# Patient Record
Sex: Female | Born: 1955
Health system: Southern US, Community
[De-identification: ages and names within clinical notes are randomized; demographics above are authoritative.]

## PROBLEM LIST (undated history)

## (undated) DIAGNOSIS — K5900 Constipation, unspecified: Secondary | ICD-10-CM

## (undated) DIAGNOSIS — I10 Essential (primary) hypertension: Secondary | ICD-10-CM

## (undated) DIAGNOSIS — E119 Type 2 diabetes mellitus without complications: Secondary | ICD-10-CM

## (undated) DIAGNOSIS — M8580 Other specified disorders of bone density and structure, unspecified site: Secondary | ICD-10-CM

## (undated) DIAGNOSIS — E785 Hyperlipidemia, unspecified: Secondary | ICD-10-CM

## (undated) DIAGNOSIS — Z5189 Encounter for other specified aftercare: Secondary | ICD-10-CM

## (undated) DIAGNOSIS — I209 Angina pectoris, unspecified: Secondary | ICD-10-CM

## (undated) DIAGNOSIS — D649 Anemia, unspecified: Secondary | ICD-10-CM

## (undated) DIAGNOSIS — N189 Chronic kidney disease, unspecified: Secondary | ICD-10-CM

## (undated) DIAGNOSIS — M1990 Unspecified osteoarthritis, unspecified site: Secondary | ICD-10-CM

## (undated) DIAGNOSIS — R011 Cardiac murmur, unspecified: Secondary | ICD-10-CM

## (undated) DIAGNOSIS — T7840XA Allergy, unspecified, initial encounter: Secondary | ICD-10-CM

## (undated) DIAGNOSIS — K219 Gastro-esophageal reflux disease without esophagitis: Secondary | ICD-10-CM

## (undated) DIAGNOSIS — C801 Malignant (primary) neoplasm, unspecified: Secondary | ICD-10-CM

## (undated) DIAGNOSIS — E7800 Pure hypercholesterolemia, unspecified: Secondary | ICD-10-CM

## (undated) HISTORY — PX: OTHER SURGICAL HISTORY: SHX169

## (undated) HISTORY — DX: Malignant (primary) neoplasm, unspecified: C80.1

## (undated) HISTORY — DX: Allergy, unspecified, initial encounter: T78.40XA

## (undated) HISTORY — PX: PELVIC FLOOR REPAIR: SHX2192

## (undated) HISTORY — PX: APPENDECTOMY: SHX54

## (undated) HISTORY — PX: COLONOSCOPY: SHX174

## (undated) HISTORY — DX: Constipation, unspecified: K59.00

## (undated) HISTORY — DX: Other specified disorders of bone density and structure, unspecified site: M85.80

## (undated) HISTORY — PX: POLYPECTOMY: SHX149

## (undated) HISTORY — PX: ABDOMINAL HYSTERECTOMY: SHX81

## (undated) HISTORY — DX: Encounter for other specified aftercare: Z51.89

---
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
## 2016-12-01 ENCOUNTER — Other Ambulatory Visit: Payer: Self-pay | Admitting: Family Medicine

## 2016-12-01 DIAGNOSIS — R0789 Other chest pain: Secondary | ICD-10-CM

## 2016-12-03 ENCOUNTER — Ambulatory Visit
Admission: RE | Admit: 2016-12-03 | Discharge: 2016-12-03 | Disposition: A | Discharge status: Home / Self Care | Source: Ambulatory Visit | Attending: Family Medicine | Admitting: Family Medicine

## 2016-12-03 ENCOUNTER — Other Ambulatory Visit: Payer: Self-pay | Admitting: Family Medicine

## 2016-12-03 DIAGNOSIS — M25551 Pain in right hip: Secondary | ICD-10-CM

## 2016-12-14 ENCOUNTER — Ambulatory Visit (INDEPENDENT_AMBULATORY_CARE_PROVIDER_SITE_OTHER)

## 2016-12-14 DIAGNOSIS — R0789 Other chest pain: Secondary | ICD-10-CM | POA: Diagnosis not present

## 2016-12-14 LAB — EXERCISE TOLERANCE TEST
CHL CUP MPHR: 158 {beats}/min
CHL CUP RESTING HR STRESS: 92 {beats}/min
CSEPHR: 94 %
Estimated workload: 5.8 METS
Exercise duration (min): 4 min
Exercise duration (sec): 0 s
Peak HR: 150 {beats}/min
RPE: 17

## 2017-12-09 ENCOUNTER — Other Ambulatory Visit: Payer: Self-pay | Admitting: Internal Medicine

## 2017-12-09 ENCOUNTER — Ambulatory Visit
Admission: RE | Admit: 2017-12-09 | Discharge: 2017-12-09 | Disposition: A | Discharge status: Home / Self Care | Source: Ambulatory Visit | Attending: Internal Medicine | Admitting: Internal Medicine

## 2017-12-09 DIAGNOSIS — R809 Proteinuria, unspecified: Secondary | ICD-10-CM

## 2017-12-30 ENCOUNTER — Other Ambulatory Visit (HOSPITAL_COMMUNITY): Payer: Self-pay | Admitting: Nephrology

## 2017-12-30 DIAGNOSIS — N049 Nephrotic syndrome with unspecified morphologic changes: Secondary | ICD-10-CM

## 2018-01-03 ENCOUNTER — Other Ambulatory Visit: Payer: Self-pay | Admitting: Radiology

## 2018-01-04 ENCOUNTER — Encounter (HOSPITAL_COMMUNITY): Payer: Self-pay

## 2018-01-04 ENCOUNTER — Ambulatory Visit (HOSPITAL_COMMUNITY)
Admission: RE | Admit: 2018-01-04 | Discharge: 2018-01-04 | Disposition: A | Discharge status: Home / Self Care | Source: Ambulatory Visit | Attending: Nephrology | Admitting: Nephrology

## 2018-01-04 DIAGNOSIS — N189 Chronic kidney disease, unspecified: Secondary | ICD-10-CM | POA: Insufficient documentation

## 2018-01-04 DIAGNOSIS — K219 Gastro-esophageal reflux disease without esophagitis: Secondary | ICD-10-CM | POA: Diagnosis not present

## 2018-01-04 DIAGNOSIS — E1122 Type 2 diabetes mellitus with diabetic chronic kidney disease: Secondary | ICD-10-CM | POA: Diagnosis not present

## 2018-01-04 DIAGNOSIS — N049 Nephrotic syndrome with unspecified morphologic changes: Secondary | ICD-10-CM | POA: Diagnosis present

## 2018-01-04 DIAGNOSIS — I129 Hypertensive chronic kidney disease with stage 1 through stage 4 chronic kidney disease, or unspecified chronic kidney disease: Secondary | ICD-10-CM | POA: Diagnosis not present

## 2018-01-04 DIAGNOSIS — Z79899 Other long term (current) drug therapy: Secondary | ICD-10-CM | POA: Diagnosis not present

## 2018-01-04 DIAGNOSIS — Z87891 Personal history of nicotine dependence: Secondary | ICD-10-CM | POA: Insufficient documentation

## 2018-01-04 HISTORY — DX: Chronic kidney disease, unspecified: N18.9

## 2018-01-04 HISTORY — DX: Type 2 diabetes mellitus without complications: E11.9

## 2018-01-04 HISTORY — DX: Gastro-esophageal reflux disease without esophagitis: K21.9

## 2018-01-04 HISTORY — DX: Cardiac murmur, unspecified: R01.1

## 2018-01-04 HISTORY — DX: Essential (primary) hypertension: I10

## 2018-01-04 LAB — CBC
HCT: 42.6 % (ref 36.0–46.0)
Hemoglobin: 13.6 g/dL (ref 12.0–15.0)
MCH: 24.5 pg — AB (ref 26.0–34.0)
MCHC: 31.9 g/dL (ref 30.0–36.0)
MCV: 76.9 fL — ABNORMAL LOW (ref 78.0–100.0)
PLATELETS: 253 10*3/uL (ref 150–400)
RBC: 5.54 MIL/uL — ABNORMAL HIGH (ref 3.87–5.11)
RDW: 19 % — ABNORMAL HIGH (ref 11.5–15.5)
WBC: 6.3 10*3/uL (ref 4.0–10.5)

## 2018-01-04 LAB — PROTIME-INR
INR: 1
PROTHROMBIN TIME: 13.1 s (ref 11.4–15.2)

## 2018-01-04 MED ORDER — MIDAZOLAM HCL 2 MG/2ML IJ SOLN
INTRAMUSCULAR | Status: AC
  Filled 2018-01-04: qty 4

## 2018-01-04 MED ORDER — SODIUM CHLORIDE 0.9 % IV SOLN
INTRAVENOUS | Status: AC | PRN
  Administered 2018-01-04: 10 mL/h via INTRAVENOUS

## 2018-01-04 MED ORDER — LIDOCAINE HCL (PF) 1 % IJ SOLN
INTRAMUSCULAR | Status: AC
  Filled 2018-01-04: qty 30

## 2018-01-04 MED ORDER — FENTANYL CITRATE (PF) 100 MCG/2ML IJ SOLN
INTRAMUSCULAR | Status: AC
  Filled 2018-01-04: qty 4

## 2018-01-04 MED ORDER — FENTANYL CITRATE (PF) 100 MCG/2ML IJ SOLN
INTRAMUSCULAR | Status: AC | PRN
  Administered 2018-01-04: 25 ug via INTRAVENOUS
  Administered 2018-01-04: 50 ug via INTRAVENOUS

## 2018-01-04 MED ORDER — MIDAZOLAM HCL 2 MG/2ML IJ SOLN
INTRAMUSCULAR | Status: AC | PRN
  Administered 2018-01-04: 1 mg via INTRAVENOUS
  Administered 2018-01-04 (×2): 0.5 mg via INTRAVENOUS

## 2018-01-04 MED ORDER — SODIUM CHLORIDE 0.9 % IV SOLN: INTRAVENOUS | Status: DC

## 2018-01-04 NOTE — Procedures (Signed)
Random renal biopsy 16 g core times two EBL 0 Comp 0

## 2018-01-04 NOTE — H&P (Signed)
Chief Complaint: Patient was seen in consultation today for concern for nephrotic syndrome  Referring Physician(s): Upton,Elizabeth  Supervising Physician: Marybelle Killings  Patient Status: [facility] - Out-pt  History of Present Illness: Mallory Lewis is a 62 y.o. female with past medical history of HTN, GERD, and DM who has recently developed symptoms of acute onset nephrotic syndrome marked by HTN, anasarca, hypercholesterolemia, and proteinuria.  IR consulted for random renal biopsy at the request of Dr. Hollie Salk.  Patient presents for procedure today in her usual state of health.  She has been NPO.  She does not take blood thinners.   Past Medical History:  Diagnosis Date  . Chronic kidney disease   . Diabetes mellitus without complication ([place])   . GERD (gastroesophageal reflux disease)   . Heart murmur   . Hypertension     Past Surgical History:  Procedure Laterality Date  . ABDOMINAL HYSTERECTOMY    . APPENDECTOMY    . mohs procedure    . PELVIC FLOOR REPAIR      Allergies: Ivp dye [iodinated diagnostic agents]  Medications: Prior to Admission medications   Medication Sig Start Date End Date Taking? Authorizing Provider  acetaminophen (TYLENOL) 650 MG CR tablet Take 1,300 mg by mouth every 8 (eight) hours as needed for pain.   Yes [provider]  Cholecalciferol (VITAMIN D-3) 5000 units TABS Take 1 tablet by mouth at bedtime.   Yes [provider]  cyclobenzaprine (FLEXERIL) 10 MG tablet Take 10 mg by mouth 3 (three) times daily as needed for muscle spasms.   Yes [provider]  ferrous sulfate (SLOW RELEASE IRON) 160 (50 Fe) MG TBCR SR tablet Take 1 tablet by mouth 2 (two) times daily.   Yes [provider]  furosemide (LASIX) 40 MG tablet Take 20-40 mg by mouth every other day.   Yes [provider]  imiquimod (ALDARA) 5 % cream Apply 1 application topically daily. Apply to chest for 6 weeks only Mon - Fri   Yes  [provider]  losartan (COZAAR) 25 MG tablet Take 25 mg by mouth every evening.   Yes [provider]  senna (SENOKOT) 8.6 MG tablet Take 3 tablets by mouth at bedtime.   Yes [provider]     Family History  Problem Relation Age of Onset  . Hypothyroidism Mother   . Atrial fibrillation Mother   . Diabetes Mother   . Dementia Father   . CAD Father   . Diabetes Father     Social History   Socioeconomic History  . Marital status: Married    Spouse name: Not on file  . Number of children: Not on file  . Years of education: Not on file  . Highest education level: Not on file  Occupational History  . Not on file  Social Needs  . Financial resource strain: Not on file  . Food insecurity:    Worry: Not on file    Inability: Not on file  . Transportation needs:    Medical: Not on file    Non-medical: Not on file  Tobacco Use  . Smoking status: Former Smoker    Packs/day: 0.25    Types: Cigarettes    Last attempt to quit: 01/04/2014    Years since quitting: 4.0  . Smokeless tobacco: Never Used  Substance and Sexual Activity  . Alcohol use: Never    Frequency: Never  . Drug use: Never  . Sexual activity: Not on file  Lifestyle  . Physical activity:    Days per week: Not on file    Minutes per session: Not on file  . Stress: Not on file  Relationships  . Social connections:    Talks on phone: Not on file    Gets together: Not on file    Attends religious service: Not on file    Active member of club or organization: Not on file    Attends meetings of clubs or organizations: Not on file    Relationship status: Not on file  Other Topics Concern  . Not on file  Social History Narrative  . Not on file     Review of Systems: A 12 point ROS discussed and pertinent positives are indicated in the HPI above.  All other systems are negative.  Review of Systems  Constitutional: Negative for fatigue and fever.  Respiratory: Negative for  cough and shortness of breath.   Cardiovascular: Negative for chest pain.  Gastrointestinal: Negative for abdominal pain.  Musculoskeletal: Positive for back pain (right flank, occasional, none today).  Psychiatric/Behavioral: Negative for behavioral problems and confusion.    Vital Signs: BP (!) 150/96   Pulse 92   Temp 98.2 F (36.8 C) (Oral)   Resp 16   Ht 5\' 5"  (1.651 m)   Wt 194 lb (88 kg)   SpO2 93%   BMI 32.28 kg/m   Physical Exam  Constitutional: She is oriented to person, place, and time. She appears well-developed.  Cardiovascular: Normal rate, regular rhythm and normal heart sounds.  Pulmonary/Chest: Effort normal and breath sounds normal. No respiratory distress.  Abdominal: Soft.  Neurological: She is alert and oriented to person, place, and time.  Skin: Skin is warm and dry.  Psychiatric: She has a normal mood and affect. Her behavior is normal. Judgment and thought content normal.  Nursing note and vitals reviewed.    MD Evaluation Airway: WNL Heart: WNL Abdomen: WNL Chest/ Lungs: WNL ASA  Classification: 3 Mallampati/Airway Score: Three   Imaging: US Renal  Result Date: 12/09/2017 CLINICAL DATA:  Proteinuria EXAM: RENAL / URINARY TRACT ULTRASOUND COMPLETE COMPARISON:  None. FINDINGS: Right Kidney: Length: 11.3 cm. Echogenicity within normal limits. No mass or hydronephrosis visualized. Left Kidney: Length: 12.3 cm. Echogenicity within normal limits. No mass or hydronephrosis visualized. Bladder: Appears normal for degree of bladder distention. Incidentally noted are shadowing stones within the gallbladder. Liver also appears slightly echogenic. IMPRESSION: 1. Normal ultrasound appearance of the kidneys 2. Gallstones 3. Suspect echogenic liver as may be seen with steatosis. Electronically Signed   By: Donavan Foil M.D.   On: 12/09/2017 14:43    Labs:  CBC: Recent Labs    01/04/18 0630  WBC 6.3  HGB 13.6  HCT 42.6  PLT 253    COAGS: Recent Labs     01/04/18 0630  INR 1.00    BMP: No results for input(s): NA, K, CL, CO2, GLUCOSE, BUN, CALCIUM, CREATININE, GFRNONAA, GFRAA in the last 8760 hours.  Invalid input(s): CMP  LIVER FUNCTION TESTS: No results for input(s): BILITOT, AST, ALT, ALKPHOS, PROT, ALBUMIN in the last 8760 hours.  TUMOR MARKERS: No results for input(s): AFPTM, CEA, CA199, CHROMGRNA in the last 8760 hours.  Assessment and Plan: Patient with past medical history of DM, HTN presents with concern for possible nephrotic syndrome.  IR consulted for renal biopsy at the request of Dr. Hollie Salk. Case reviewed by Dr. Barbie Banner who approves patient for procedure.  Patient presents today in their usual  state of health.  She has been NPO and is not currently on blood thinners.    Risks and benefits discussed with the patient including, but not limited to bleeding, infection, damage to adjacent structures or low yield requiring additional tests.  All of the patient's questions were answered, patient is agreeable to proceed. Consent signed and in chart.   Thank you for this interesting consult.  I greatly enjoyed meeting Madoline Bhatt and look forward to participating in their care.  A copy of this report was sent to the requesting provider on this date.  Electronically Signed: Docia Barrier, PA 01/04/2018, 7:37 AM   I spent a total of  30 Minutes   in face to face in clinical consultation, greater than 50% of which was counseling/coordinating care for nephrotic syndrome.

## 2018-01-04 NOTE — Discharge Instructions (Addendum)
**Note Ruth Tully-identified via Obfuscation** Percutaneous Kidney Biopsy, Care After °This sheet gives you information about how to care for yourself after your procedure. Your health care provider may also give you more specific instructions. If you have problems or questions, contact your health care provider. °What can I expect after the procedure? °After the procedure, it is common to have: °· Pain or soreness near the area where the needle went through your skin (biopsy site). °· Bright pink or cloudy urine for 24 hours after the procedure. ° °Follow these instructions at home: °Activity °· Return to your normal activities as told by your health care provider. Ask your health care provider what activities are safe for you. °· Do not drive for 24 hours if you were given a medicine to help you relax (sedative). °· Do not lift anything that is heavier than 10 lb (4.5 kg) until your health care provider tells you that it is safe. °· Avoid activities that take a lot of effort (are strenuous) until your health care provider approves. Most people will have to wait 2 weeks before returning to activities such as exercise or sexual intercourse. °General instructions °· Take over-the-counter and prescription medicines only as told by your health care provider. °· You may eat and drink after your procedure. Follow instructions from your health care provider about eating or drinking restrictions. °· Check your biopsy site every day for signs of infection. Check for: °? More redness, swelling, or pain. °? More fluid or blood. °? Warmth. °? Pus or a bad smell. °· Keep all follow-up visits as told by your health care provider. This is important. °Contact a health care provider if: °· You have more redness, swelling, or pain around your biopsy site. °· You have more fluid or blood coming from your biopsy site. °· Your biopsy site feels warm to the touch. °· You have pus or a bad smell coming from your biopsy site. °· You have blood in your urine more than 24 hours after  your procedure. °Get help right away if: °· You have dark red or brown urine. °· You have a fever. °· You are unable to urinate. °· You feel burning when you urinate. °· You feel faint. °· You have severe pain in your abdomen or side. °This information is not intended to replace advice given to you by your health care provider. Make sure you discuss any questions you have with your health care provider. °Document Released: 05/30/2013 Document Revised: 07/09/2016 Document Reviewed: 07/09/2016 °Elsevier Interactive Patient Education © 2018 Elsevier Inc. °Moderate Conscious Sedation, Adult, Care After °These instructions provide you with information about caring for yourself after your procedure. Your health care provider may also give you more specific instructions. Your treatment has been planned according to current medical practices, but problems sometimes occur. Call your health care provider if you have any problems or questions after your procedure. °What can I expect after the procedure? °After your procedure, it is common: °· To feel sleepy for several hours. °· To feel clumsy and have poor balance for several hours. °· To have poor judgment for several hours. °· To vomit if you eat too soon. ° °Follow these instructions at home: °For at least 24 hours after the procedure: ° °· Do not: °? Participate in activities where you could fall or become injured. °? Drive. °? Use heavy machinery. °? Drink alcohol. °? Take sleeping pills or medicines that cause drowsiness. °? Make important decisions or sign legal documents. °? Take care of children  **Note Kimoni Pickerill-Identified via Obfuscation** on your own.  Rest. Eating and drinking  Follow the diet recommended by your health care provider.  If you vomit: ? Drink water, juice, or soup when you can drink without vomiting. ? Make sure you have little or no nausea before eating solid foods. General instructions  Have a responsible adult stay with you until you are awake and alert.  Take over-the-counter  and prescription medicines only as told by your health care provider.  If you smoke, do not smoke without supervision.  Keep all follow-up visits as told by your health care provider. This is important. Contact a health care provider if:  You keep feeling nauseous or you keep vomiting.  You feel light-headed.  You develop a rash.  You have a fever. Get help right away if:  You have trouble breathing. This information is not intended to replace advice given to you by your health care provider. Make sure you discuss any questions you have with your health care provider. Document Released: 07/18/2013 Document Revised: 03/01/2016 Document Reviewed: 01/17/2016 Elsevier Interactive Patient Education  2018 [place].  Moderate Conscious Sedation, Adult, Care After These instructions provide you with information about caring for yourself after your procedure. Your health care provider may also give you more specific instructions. Your treatment has been planned according to current medical practices, but problems sometimes occur. Call your health care provider if you have any problems or questions after your procedure. What can I expect after the procedure? After your procedure, it is common:  To feel sleepy for several hours.  To feel clumsy and have poor balance for several hours.  To have poor judgment for several hours.  To vomit if you eat too soon.  Follow these instructions at home: For at least 24 hours after the procedure:   Do not: ? Participate in activities where you could fall or become injured. ? Drive. ? Use heavy machinery. ? Drink alcohol. ? Take sleeping pills or medicines that cause drowsiness. ? Make important decisions or sign legal documents. ? Take care of children on your own.  Rest. Eating and drinking  Follow the diet recommended by your health care provider.  If you vomit: ? Drink water, juice, or soup when you can drink without  vomiting. ? Make sure you have little or no nausea before eating solid foods. General instructions  Have a responsible adult stay with you until you are awake and alert.  Take over-the-counter and prescription medicines only as told by your health care provider.  If you smoke, do not smoke without supervision.  Keep all follow-up visits as told by your health care provider. This is important. Contact a health care provider if:  You keep feeling nauseous or you keep vomiting.  You feel light-headed.  You develop a rash.  You have a fever. Get help right away if:  You have trouble breathing. This information is not intended to replace advice given to you by your health care provider. Make sure you discuss any questions you have with your health care provider. Document Released: 07/18/2013 Document Revised: 03/01/2016 Document Reviewed: 01/17/2016 Elsevier Interactive Patient Education  [employer].

## 2018-01-04 NOTE — Sedation Documentation (Signed)
Patient is resting comfortably. 

## 2018-01-12 ENCOUNTER — Encounter (HOSPITAL_COMMUNITY): Payer: Self-pay | Admitting: Nephrology

## 2018-07-19 ENCOUNTER — Other Ambulatory Visit: Payer: Self-pay | Admitting: Orthopaedic Surgery

## 2018-08-17 NOTE — Pre-Procedure Instructions (Signed)
Mallory Lewis  08/17/2018      [id number] - [place], [place] - [address] [place] [address] Phone: [phone] Fax: [phone]  [id number] - [place], [place] - [address] AT [facility] [address] [address] [place] [address] Phone: [phone] Fax: [phone]    Your procedure is scheduled on Tuesday November 19th.  Report to [facility] at 0800 A.M.  Call this number if you have problems the morning of surgery:  [phone]   Remember:  Do not eat or drink after midnight.    Take these medicines the morning of surgery with A SIP OF WATER   acetaminophen (TYLENOL) if needed  cyclobenzaprine (FLEXERIL) if needed  omeprazole (PRILOSEC)  tacrolimus (PROGRAF)  7 days prior to surgery STOP taking any Aspirin(unless otherwise instructed by your surgeon), Aleve, Naproxen, Ibuprofen, Motrin, Advil, Goody's, BC's, all herbal medications, fish oil, and all vitamins     Do not wear jewelry, make-up or nail polish.  Do not wear lotions, powders, or perfumes, or deodorant.  Do not shave 48 hours prior to surgery.  Men may shave face and neck.  Do not bring valuables to the hospital.  [facility] is not responsible for any belongings or valuables.  Contacts, dentures or bridgework may not be worn into surgery.  Leave your suitcase in the car.  After surgery it may be brought to your room.  For patients admitted to the hospital, discharge time will be determined by your treatment team.  Patients discharged the day of surgery will not be allowed to drive home.    [place]- Preparing For Surgery  Before surgery, you can play an important role. Because skin is not sterile, your skin needs to be as free of germs as possible. You can reduce the number of germs on your skin by washing with CHG (chlorahexidine gluconate) Soap before surgery.  CHG  is an antiseptic cleaner which kills germs and bonds with the skin to continue killing germs even after washing.    Oral Hygiene is also important to reduce your risk of infection.  Remember - BRUSH YOUR TEETH THE MORNING OF SURGERY WITH YOUR REGULAR TOOTHPASTE  Please do not use if you have an allergy to CHG or antibacterial soaps. If your skin becomes reddened/irritated stop using the CHG.  Do not shave (including legs and underarms) for at least 48 hours prior to first CHG shower. It is OK to shave your face.  Please follow these instructions carefully.   1. Shower the NIGHT BEFORE SURGERY and the MORNING OF SURGERY with CHG.   2. If you chose to wash your hair, wash your hair first as usual with your normal shampoo.  3. After you shampoo, rinse your hair and body thoroughly to remove the shampoo.  4. Use CHG as you would any other liquid soap. You can apply CHG directly to the skin and wash gently with a scrungie or a clean washcloth.   5. Apply the CHG Soap to your body ONLY FROM THE NECK DOWN.  Do not use on open wounds or open sores. Avoid contact with your eyes, ears, mouth and genitals (private parts). Wash Face and genitals (private parts)  with your normal soap.  6. Wash thoroughly, paying special attention to the area where your surgery will be performed.  7. Thoroughly rinse your body with warm water from  the neck down.  8. DO NOT shower/wash with your normal soap after using and rinsing off the CHG Soap.  9. Pat yourself dry with a CLEAN TOWEL.  10. Wear CLEAN PAJAMAS to bed the night before surgery, wear comfortable clothes the morning of surgery  11. Place CLEAN SHEETS on your bed the night of your first shower and DO NOT SLEEP WITH PETS.    Day of Surgery:  Do not apply any deodorants/lotions.  Please wear clean clothes to the hospital/surgery center.   Remember to brush your teeth WITH YOUR REGULAR TOOTHPASTE.    Please read over the following fact sheets  that you were given.

## 2018-08-18 ENCOUNTER — Ambulatory Visit (HOSPITAL_COMMUNITY)
Admission: RE | Admit: 2018-08-18 | Discharge: 2018-08-18 | Disposition: A | Discharge status: Home / Self Care | Source: Ambulatory Visit | Attending: Orthopaedic Surgery | Admitting: Orthopaedic Surgery

## 2018-08-18 ENCOUNTER — Encounter (HOSPITAL_COMMUNITY)
Admission: RE | Admit: 2018-08-18 | Discharge: 2018-08-18 | Disposition: A | Discharge status: Home / Self Care | Source: Ambulatory Visit | Attending: Orthopaedic Surgery | Admitting: Orthopaedic Surgery

## 2018-08-18 ENCOUNTER — Other Ambulatory Visit: Payer: Self-pay

## 2018-08-18 ENCOUNTER — Encounter (HOSPITAL_COMMUNITY): Payer: Self-pay

## 2018-08-18 DIAGNOSIS — Z01818 Encounter for other preprocedural examination: Secondary | ICD-10-CM | POA: Insufficient documentation

## 2018-08-18 HISTORY — DX: Unspecified osteoarthritis, unspecified site: M19.90

## 2018-08-18 HISTORY — DX: Anemia, unspecified: D64.9

## 2018-08-18 LAB — BASIC METABOLIC PANEL
ANION GAP: 13 (ref 5–15)
BUN: 11 mg/dL (ref 8–23)
CALCIUM: 9.6 mg/dL (ref 8.9–10.3)
CO2: 22 mmol/L (ref 22–32)
Chloride: 104 mmol/L (ref 98–111)
Creatinine, Ser: 0.72 mg/dL (ref 0.44–1.00)
GFR calc Af Amer: >60 mL/min (ref >60)
GLUCOSE: 98 mg/dL (ref 70–99)
Potassium: 3.4 mmol/L — ABNORMAL LOW (ref 3.5–5.1)
SODIUM: 139 mmol/L (ref 135–145)

## 2018-08-18 LAB — CBC WITH DIFFERENTIAL/PLATELET
ABS IMMATURE GRANULOCYTES: 0.02 10*3/uL (ref 0.00–0.07)
BASOS PCT: 1 %
Basophils Absolute: 0.1 10*3/uL (ref 0.0–0.1)
EOS ABS: 0.2 10*3/uL (ref 0.0–0.5)
Eosinophils Relative: 3 %
HCT: 46.7 % — ABNORMAL HIGH (ref 36.0–46.0)
Hemoglobin: 15 g/dL (ref 12.0–15.0)
Immature Granulocytes: 0 %
Lymphocytes Relative: 31 %
Lymphs Abs: 2.4 10*3/uL (ref 0.7–4.0)
MCH: 28.4 pg (ref 26.0–34.0)
MCHC: 32.1 g/dL (ref 30.0–36.0)
MCV: 88.3 fL (ref 80.0–100.0)
MONO ABS: 0.7 10*3/uL (ref 0.1–1.0)
Monocytes Relative: 8 %
NEUTROS ABS: 4.4 10*3/uL (ref 1.7–7.7)
NEUTROS PCT: 57 %
Platelets: 280 10*3/uL (ref 150–400)
RBC: 5.29 MIL/uL — AB (ref 3.87–5.11)
RDW: 12.1 % (ref 11.5–15.5)
WBC: 7.7 10*3/uL (ref 4.0–10.5)
nRBC: 0 % (ref 0.0–0.2)

## 2018-08-18 LAB — URINALYSIS, ROUTINE W REFLEX MICROSCOPIC
Bilirubin Urine: NEGATIVE
GLUCOSE, UA: NEGATIVE mg/dL
Hgb urine dipstick: NEGATIVE
Ketones, ur: NEGATIVE mg/dL
LEUKOCYTES UA: NEGATIVE
NITRITE: NEGATIVE
PH: 5 (ref 5.0–8.0)
Protein, ur: NEGATIVE mg/dL
Specific Gravity, Urine: 1.01 (ref 1.005–1.030)

## 2018-08-18 LAB — TYPE AND SCREEN
ABO/RH(D): O NEG
Antibody Screen: NEGATIVE

## 2018-08-18 LAB — PROTIME-INR
INR: 1.07
Prothrombin Time: 13.8 seconds (ref 11.4–15.2)

## 2018-08-18 LAB — SURGICAL PCR SCREEN
MRSA, PCR: NEGATIVE
STAPHYLOCOCCUS AUREUS: NEGATIVE

## 2018-08-18 LAB — ABO/RH: ABO/RH(D): O NEG

## 2018-08-18 LAB — APTT: APTT: 29 s (ref 24–36)

## 2018-08-18 NOTE — Progress Notes (Signed)
PCP - [facility]  Cardiologist - Dr. Irven Shelling group  Chest x-ray - 08/18/18  EKG - 08/18/18  Stress Test - 12/14/16 (E)  ECHO - Denies  Cardiac Cath - Denies  AICD- na PM- na LOOP-na  Sleep Study - Denies CPAP - None  LABS- 08/18/18: CBC w/D, BMP, PT, PTT, T/S, PCR, UA  ASA- LD- 08/24/18   Anesthesia- Yes-medical history  Pt denies having chest pain, sob, or fever at this time. All instructions explained to the pt, with a verbal understanding of the material. Pt agrees to go over the instructions while at home for a better understanding. The opportunity to ask questions was provided.

## 2018-08-22 ENCOUNTER — Encounter (HOSPITAL_COMMUNITY): Payer: Self-pay

## 2018-08-22 ENCOUNTER — Other Ambulatory Visit: Payer: Self-pay | Admitting: Orthopaedic Surgery

## 2018-08-22 NOTE — Anesthesia Preprocedure Evaluation (Addendum)
Anesthesia Evaluation  Patient identified by MRN, date of birth, ID band Patient awake    Reviewed: Allergy & Precautions, NPO status , Patient's Chart, lab work & pertinent test results  History of Anesthesia Complications Negative for: history of anesthetic complications  Airway Mallampati: II  TM Distance: >3 FB Neck ROM: Full    Dental no notable dental hx.    Pulmonary neg pulmonary ROS, former smoker,    Pulmonary exam normal        Cardiovascular hypertension, Pt. on medications Normal cardiovascular exam  Stress Test 12/14/16:  Blood pressure demonstrated a hypertensive response to exercise.  No T wave inversion was noted during stress.  There was no ST segment deviation noted during stress.  Overall, the patient's exercise capacity was mildly impaired.  [facility] Treadmill Score: low risk Negative stress test without evidence of ischemia at given workload.   Neuro/Psych negative neurological ROS  negative psych ROS   GI/Hepatic Neg liver ROS, GERD  ,  Endo/Other  diabetes, Type 2  Renal/GU Renal InsufficiencyRenal disease (Tacrolimus for FSGS, thin basement membrane disease)  negative genitourinary   Musculoskeletal  (+) Arthritis ,   Abdominal   Peds  Hematology negative hematology ROS (+)   Anesthesia Other Findings   Reproductive/Obstetrics                           Anesthesia Physical Anesthesia Plan  ASA: III  Anesthesia Plan: General   Post-op Pain Management:    Induction: Intravenous  PONV Risk Score and Plan: 3 and Ondansetron, Dexamethasone, Midazolam and Treatment may vary due to age or medical condition  Airway Management Planned: Oral ETT  Additional Equipment: None  Intra-op Plan:   Post-operative Plan: Extubation in OR  Informed Consent: I have reviewed the patients History and Physical, chart, labs and discussed the procedure including the risks,  benefits and alternatives for the proposed anesthesia with the patient or authorized representative who has indicated his/her understanding and acceptance.     Plan Discussed with:   Anesthesia Plan Comments: (See PAT note 08/18/2018 by Karoline Caldwell, PA-C )      Anesthesia Quick Evaluation

## 2018-08-22 NOTE — Progress Notes (Signed)
Anesthesia Chart Review:  Case:  [id number] Date/Time:  08/29/18 0945   Procedure:  TOTAL HIP ARTHROPLASTY ANTERIOR APPROACH (Right )   Anesthesia type:  Spinal   Pre-op diagnosis:  RIGHT HIP DEGENERATIVE JOINT DISEASE   Location:  [facility] [place] [facility] [place] [facility]   Surgeon:  Melrose Nakayama, MD      DISCUSSION: 62 yo female former smoker. Pertinent hx includes GERD, Thin basement membrane disease, FSGS, HTN, DMII.  Pt had nephrotic syndrome February 2019. Was diagnosed with thin basement membrane disease and FSGS via biopsy. She now follows with Dr. Hollie Salk and is treated with Tacrolimus. She says Tacrolimus was preferred over steroid due to her DMII. She reports she is doing well and currently in remission. Kidney function normal by labs. She was told by Dr. Rhona Raider to hold Tacrolimus 5d preop.  Pt also told me that she refuses spinal anesthesia and has discussed this with Dr. Rhona Raider. I spoke with his scheduler Juliann Pulse as it is currently posted for spinal. She said she will update the posting.  Anticipate she can proceed as planned barring acute status change.   VS: BP 135/77   Pulse 74   Temp 36.4 C   Resp (!) 88   Ht 5\' 6"  (1.676 m)   SpO2 97%   BMI 31.31 kg/m   PROVIDERS: Rolene Course, PA-C is PCP  Madelon Lips, MD is Nephrologist  LABS: Labs reviewed: Acceptable for surgery. (all labs ordered are listed, but only abnormal results are displayed)  Labs Reviewed  BASIC METABOLIC PANEL - Abnormal; Notable for the following components:      Result Value   Potassium 3.4 (*)    All other components within normal limits  CBC WITH DIFFERENTIAL/PLATELET - Abnormal; Notable for the following components:   RBC 5.29 (*)    HCT 46.7 (*)    All other components within normal limits  URINALYSIS, ROUTINE W REFLEX MICROSCOPIC - Abnormal; Notable for the following components:   Color, Urine STRAW (*)    All other components within normal limits  SURGICAL PCR SCREEN  APTT  PROTIME-INR   TYPE AND SCREEN  ABO/RH     IMAGES: CHEST - 2 VIEW 08/18/2018  COMPARISON:  10/20/2015  FINDINGS: No acute airspace disease or pleural effusion. Stable cardiomediastinal silhouette. No pneumothorax.  IMPRESSION: No active cardiopulmonary disease.  EKG: 08/18/2018: Normal sinus rhythm. Rate 84. Cannot rule out Inferior infarct , age undetermined  CV: Exercise stress test 12/14/2016:  Blood pressure demonstrated a hypertensive response to exercise.  No T wave inversion was noted during stress.  There was no ST segment deviation noted during stress.  Overall, the patient's exercise capacity was mildly impaired.  [facility] Treadmill Score: low risk   Negative stress test without evidence of ischemia at given workload.  Past Medical History:  Diagnosis Date  . Anemia   . Arthritis   . Chronic kidney disease   . Diabetes mellitus without complication ([place])   . GERD (gastroesophageal reflux disease)   . Heart murmur   . Hypertension     Past Surgical History:  Procedure Laterality Date  . ABDOMINAL HYSTERECTOMY    . APPENDECTOMY    . mohs procedure    . PELVIC FLOOR REPAIR      MEDICATIONS: . acetaminophen (TYLENOL) 650 MG CR tablet  . aspirin EC 81 MG tablet  . Biotin 5 MG CAPS  . Cholecalciferol (VITAMIN D-3) 5000 units TABS  . cyclobenzaprine (FLEXERIL) 10 MG tablet  . Ferrous  Sulfate Dried (SLOW RELEASE IRON) 45 MG TBCR  . furosemide (LASIX) 40 MG tablet  . KLOR-CON M20 20 MEQ tablet  . losartan (COZAAR) 100 MG tablet  . omeprazole (PRILOSEC) 40 MG capsule  . rosuvastatin (CRESTOR) 5 MG tablet  . senna (SENOKOT) 8.6 MG tablet  . tacrolimus (PROGRAF) 0.5 MG capsule  . tretinoin (RETIN-A) 0.025 % cream   No current facility-administered medications for this encounter.     Wynonia Musty [facility] Short Stay Center/Anesthesiology Phone [phone] 08/22/2018 2:52 PM

## 2018-08-22 NOTE — H&P (Signed)
TOTAL HIP ADMISSION H&P  Patient is admitted for right total hip arthroplasty.  Subjective:  Chief Complaint: right hip pain  HPI: Mallory Lewis, 62 y.o. female, has a history of pain and functional disability in the right hip(s) due to arthritis and patient has failed non-surgical conservative treatments for greater than 12 weeks to include NSAID's and/or analgesics, corticosteriod injections, flexibility and strengthening excercises, use of assistive devices, weight reduction as appropriate and activity modification.  Onset of symptoms was gradual starting 5 years ago with gradually worsening course since that time.The patient noted no past surgery on the right hip(s).  Patient currently rates pain in the right hip at 10 out of 10 with activity. Patient has night pain, worsening of pain with activity and weight bearing, trendelenberg gait, pain that interfers with activities of daily living and crepitus. Patient has evidence of subchondral cysts, subchondral sclerosis, periarticular osteophytes and joint space narrowing by imaging studies. This condition presents safety issues increasing the risk of falls. There is no current active infection.  There are no active problems to display for this patient.  Past Medical History:  Diagnosis Date  . Anemia   . Arthritis   . Chronic kidney disease   . Diabetes mellitus without complication ([place])   . GERD (gastroesophageal reflux disease)   . Heart murmur   . Hypertension     Past Surgical History:  Procedure Laterality Date  . ABDOMINAL HYSTERECTOMY    . APPENDECTOMY    . mohs procedure    . PELVIC FLOOR REPAIR      No current facility-administered medications for this encounter.    Current Outpatient Medications  Medication Sig Dispense Refill Last Dose  . acetaminophen (TYLENOL) 650 MG CR tablet Take 1,300 mg by mouth 2 (two) times daily as needed for pain.    01/03/2018 at 2000  . aspirin EC 81 MG tablet Take 81 mg by mouth daily.      . Biotin 5 MG CAPS Take 5 mg by mouth every evening.     . Cholecalciferol (VITAMIN D-3) 5000 units TABS Take 5,000 tablets by mouth every evening.    01/03/2018 at 2000  . Ferrous Sulfate Dried (SLOW RELEASE IRON) 45 MG TBCR Take 45 tablets by mouth 2 (two) times daily.    01/03/2018 at 2000  . furosemide (LASIX) 40 MG tablet Take 40 mg by mouth daily.    01/04/2018 at 0500  . KLOR-CON M20 20 MEQ tablet Take 40 mEq by mouth daily.  6   . losartan (COZAAR) 100 MG tablet Take 100 mg by mouth every evening.    01/03/2018 at 2000  . omeprazole (PRILOSEC) 40 MG capsule Take 40 mg by mouth daily.     . rosuvastatin (CRESTOR) 5 MG tablet Take 5 mg by mouth every evening.  3   . senna (SENOKOT) 8.6 MG tablet Take 3 tablets by mouth every evening.    01/03/2018 at 2000  . tacrolimus (PROGRAF) 0.5 MG capsule Take 0.5 mg by mouth 2 (two) times daily.  3   . tretinoin (RETIN-A) 0.025 % cream Apply 1 application topically daily as needed (blemishes).   11   . cyclobenzaprine (FLEXERIL) 10 MG tablet Take 10 mg by mouth 3 (three) times daily as needed for muscle spasms.   Past Month at Unknown time   Allergies  Allergen Reactions  . Ivp Dye [Iodinated Diagnostic Agents] Hives    Social History   Tobacco Use  . Smoking status: Former Smoker  Packs/day: 0.25    Types: Cigarettes    Last attempt to quit: 01/04/2014    Years since quitting: 4.6  . Smokeless tobacco: Never Used  Substance Use Topics  . Alcohol use: Never    Frequency: Never    Family History  Problem Relation Age of Onset  . Hypothyroidism Mother   . Atrial fibrillation Mother   . Diabetes Mother   . Dementia Father   . CAD Father   . Diabetes Father      Review of Systems  Musculoskeletal: Positive for joint pain.       Right hip  All other systems reviewed and are negative.   Objective:  Physical Exam  Constitutional: She is oriented to person, place, and time. She appears well-developed and well-nourished.  HENT:   Head: Normocephalic and atraumatic.  Eyes: Pupils are equal, round, and reactive to light.  Neck: Normal range of motion.  Cardiovascular: Normal rate and regular rhythm.  Respiratory: Effort normal.  GI: Soft.  Musculoskeletal:  Right hip motion is little limited and fairly painful in internal rotation.  Her leg lengths are roughly equal.  Other hip moves well.  Sensation and motor function are intact in her feet with palpable pulses on both sides.    Neurological: She is alert and oriented to person, place, and time.  Skin: Skin is warm and dry.  Psychiatric: She has a normal mood and affect. Her behavior is normal. Judgment and thought content normal.    Vital signs in last 24 hours:    Labs:   Estimated body mass index is 31.31 kg/m as calculated from the following:   Height as of 08/18/18: 5\' 6"  (1.676 m).   Weight as of 01/04/18: 88 kg.   Imaging Review Plain radiographs demonstrate severe degenerative joint disease of the right hip(s). The bone quality appears to be good for age and reported activity level.    Preoperative templating of the joint replacement has been completed, documented, and submitted to the Operating Room personnel in order to optimize intra-operative equipment management.     Assessment/Plan:  End stage primary arthritis, right hip(s)  The patient history, physical examination, clinical judgement of the provider and imaging studies are consistent with end stage degenerative joint disease of the right hip(s) and total hip arthroplasty is deemed medically necessary. The treatment options including medical management, injection therapy, arthroscopy and arthroplasty were discussed at length. The risks and benefits of total hip arthroplasty were presented and reviewed. The risks due to aseptic loosening, infection, stiffness, dislocation/subluxation,  thromboembolic complications and other imponderables were discussed.  The patient acknowledged the  explanation, agreed to proceed with the plan and consent was signed. Patient is being admitted for inpatient treatment for surgery, pain control, PT, OT, prophylactic antibiotics, VTE prophylaxis, progressive ambulation and ADL's and discharge planning.The patient is planning to be discharged home with home health services

## 2018-08-28 MED ORDER — BUPIVACAINE LIPOSOME 1.3 % IJ SUSP
10.0000 mL | INTRAMUSCULAR | Status: AC
  Administered 2018-08-29: 20 mL
  Filled 2018-08-28: qty 10

## 2018-08-28 MED ORDER — TRANEXAMIC ACID-NACL 1000-0.7 MG/100ML-% IV SOLN
1000.0000 mg | INTRAVENOUS | Status: AC
  Administered 2018-08-29: 1000 mg via INTRAVENOUS
  Filled 2018-08-28: qty 100

## 2018-08-28 MED ORDER — TRANEXAMIC ACID 1000 MG/10ML IV SOLN
2000.0000 mg | INTRAVENOUS | Status: AC
  Administered 2018-08-29: 2000 mg via TOPICAL
  Filled 2018-08-28: qty 20

## 2018-08-29 ENCOUNTER — Inpatient Hospital Stay (HOSPITAL_COMMUNITY)
Admission: RE | Admit: 2018-08-29 | Discharge: 2018-08-30 | DRG: 470 | Disposition: A | Discharge status: Home / Self Care | Attending: Orthopaedic Surgery | Admitting: Orthopaedic Surgery

## 2018-08-29 ENCOUNTER — Encounter (HOSPITAL_COMMUNITY): Payer: Self-pay | Admitting: General Practice

## 2018-08-29 ENCOUNTER — Inpatient Hospital Stay (HOSPITAL_COMMUNITY): Admitting: Physician Assistant

## 2018-08-29 ENCOUNTER — Other Ambulatory Visit: Payer: Self-pay

## 2018-08-29 ENCOUNTER — Inpatient Hospital Stay (HOSPITAL_COMMUNITY)

## 2018-08-29 ENCOUNTER — Encounter (HOSPITAL_COMMUNITY)
Admission: RE | Disposition: A | Discharge status: Home / Self Care | Payer: Self-pay | Source: Home / Self Care | Attending: Orthopaedic Surgery

## 2018-08-29 DIAGNOSIS — Z833 Family history of diabetes mellitus: Secondary | ICD-10-CM

## 2018-08-29 DIAGNOSIS — N189 Chronic kidney disease, unspecified: Secondary | ICD-10-CM | POA: Diagnosis present

## 2018-08-29 DIAGNOSIS — Z9181 History of falling: Secondary | ICD-10-CM | POA: Diagnosis not present

## 2018-08-29 DIAGNOSIS — Z8349 Family history of other endocrine, nutritional and metabolic diseases: Secondary | ICD-10-CM | POA: Diagnosis not present

## 2018-08-29 DIAGNOSIS — Z90710 Acquired absence of both cervix and uterus: Secondary | ICD-10-CM | POA: Diagnosis not present

## 2018-08-29 DIAGNOSIS — M1611 Unilateral primary osteoarthritis, right hip: Secondary | ICD-10-CM | POA: Diagnosis present

## 2018-08-29 DIAGNOSIS — D649 Anemia, unspecified: Secondary | ICD-10-CM | POA: Diagnosis present

## 2018-08-29 DIAGNOSIS — Z8249 Family history of ischemic heart disease and other diseases of the circulatory system: Secondary | ICD-10-CM

## 2018-08-29 DIAGNOSIS — E1122 Type 2 diabetes mellitus with diabetic chronic kidney disease: Secondary | ICD-10-CM | POA: Diagnosis present

## 2018-08-29 DIAGNOSIS — K219 Gastro-esophageal reflux disease without esophagitis: Secondary | ICD-10-CM | POA: Diagnosis present

## 2018-08-29 DIAGNOSIS — I129 Hypertensive chronic kidney disease with stage 1 through stage 4 chronic kidney disease, or unspecified chronic kidney disease: Secondary | ICD-10-CM | POA: Diagnosis present

## 2018-08-29 DIAGNOSIS — Z7982 Long term (current) use of aspirin: Secondary | ICD-10-CM | POA: Diagnosis not present

## 2018-08-29 DIAGNOSIS — Z91041 Radiographic dye allergy status: Secondary | ICD-10-CM

## 2018-08-29 DIAGNOSIS — Z79899 Other long term (current) drug therapy: Secondary | ICD-10-CM | POA: Diagnosis not present

## 2018-08-29 DIAGNOSIS — Z419 Encounter for procedure for purposes other than remedying health state, unspecified: Secondary | ICD-10-CM

## 2018-08-29 DIAGNOSIS — Z87891 Personal history of nicotine dependence: Secondary | ICD-10-CM

## 2018-08-29 HISTORY — PX: TOTAL HIP ARTHROPLASTY: SHX124

## 2018-08-29 LAB — GLUCOSE, CAPILLARY
Glucose-Capillary: 115 mg/dL — ABNORMAL HIGH (ref 70–99)
Glucose-Capillary: 128 mg/dL — ABNORMAL HIGH (ref 70–99)

## 2018-08-29 SURGERY — ARTHROPLASTY, HIP, TOTAL, ANTERIOR APPROACH
Anesthesia: General | Site: Hip | Laterality: Right

## 2018-08-29 MED ORDER — FENTANYL CITRATE (PF) 250 MCG/5ML IJ SOLN
INTRAMUSCULAR | Status: AC
  Filled 2018-08-29: qty 5

## 2018-08-29 MED ORDER — PHENOL 1.4 % MT LIQD: 1.0000 | OROMUCOSAL | Status: DC | PRN

## 2018-08-29 MED ORDER — MENTHOL 3 MG MT LOZG: 1.0000 | LOZENGE | OROMUCOSAL | Status: DC | PRN

## 2018-08-29 MED ORDER — CEFAZOLIN SODIUM-DEXTROSE 2-4 GM/100ML-% IV SOLN: 2.0000 g | INTRAVENOUS | Status: DC

## 2018-08-29 MED ORDER — LIDOCAINE 2% (20 MG/ML) 5 ML SYRINGE
INTRAMUSCULAR | Status: DC | PRN
  Administered 2018-08-29: 60 mg via INTRAVENOUS

## 2018-08-29 MED ORDER — MIDAZOLAM HCL 2 MG/2ML IJ SOLN
INTRAMUSCULAR | Status: AC
  Filled 2018-08-29: qty 2

## 2018-08-29 MED ORDER — DIPHENHYDRAMINE HCL 12.5 MG/5ML PO ELIX: 12.5000 mg | ORAL_SOLUTION | ORAL | Status: DC | PRN

## 2018-08-29 MED ORDER — PROPOFOL 10 MG/ML IV BOLUS
INTRAVENOUS | Status: DC | PRN
  Administered 2018-08-29: 140 mg via INTRAVENOUS

## 2018-08-29 MED ORDER — BISACODYL 5 MG PO TBEC: 5.0000 mg | DELAYED_RELEASE_TABLET | Freq: Every day | ORAL | Status: DC | PRN

## 2018-08-29 MED ORDER — LOSARTAN POTASSIUM 50 MG PO TABS
100.0000 mg | ORAL_TABLET | Freq: Every evening | ORAL | Status: DC
  Administered 2018-08-29: 100 mg via ORAL
  Filled 2018-08-29: qty 2

## 2018-08-29 MED ORDER — METHOCARBAMOL 500 MG PO TABS
ORAL_TABLET | ORAL | Status: AC
  Filled 2018-08-29: qty 1

## 2018-08-29 MED ORDER — DOCUSATE SODIUM 100 MG PO CAPS
100.0000 mg | ORAL_CAPSULE | Freq: Two times a day (BID) | ORAL | Status: DC
  Administered 2018-08-29 – 2018-08-30 (×2): 100 mg via ORAL
  Filled 2018-08-29 (×2): qty 1

## 2018-08-29 MED ORDER — MIDAZOLAM HCL 5 MG/5ML IJ SOLN
INTRAMUSCULAR | Status: DC | PRN
  Administered 2018-08-29: 2 mg via INTRAVENOUS

## 2018-08-29 MED ORDER — ONDANSETRON HCL 4 MG/2ML IJ SOLN
INTRAMUSCULAR | Status: DC | PRN
  Administered 2018-08-29: 4 mg via INTRAVENOUS

## 2018-08-29 MED ORDER — LACTATED RINGERS IV SOLN
INTRAVENOUS | Status: DC
  Administered 2018-08-29: 14:00:00 via INTRAVENOUS

## 2018-08-29 MED ORDER — LACTATED RINGERS IV SOLN: INTRAVENOUS | Status: DC

## 2018-08-29 MED ORDER — HYDROCODONE-ACETAMINOPHEN 5-325 MG PO TABS
1.0000 | ORAL_TABLET | ORAL | Status: DC | PRN
  Administered 2018-08-29: 2 via ORAL
  Administered 2018-08-30: 1 via ORAL
  Filled 2018-08-29: qty 2
  Filled 2018-08-29: qty 1

## 2018-08-29 MED ORDER — HYDROCODONE-ACETAMINOPHEN 7.5-325 MG PO TABS: 1.0000 | ORAL_TABLET | ORAL | Status: DC | PRN

## 2018-08-29 MED ORDER — SUGAMMADEX SODIUM 200 MG/2ML IV SOLN
INTRAVENOUS | Status: DC | PRN
  Administered 2018-08-29: 200 mg via INTRAVENOUS

## 2018-08-29 MED ORDER — FUROSEMIDE 40 MG PO TABS
40.0000 mg | ORAL_TABLET | Freq: Every day | ORAL | Status: DC
  Administered 2018-08-30: 40 mg via ORAL
  Filled 2018-08-29: qty 1

## 2018-08-29 MED ORDER — POTASSIUM CHLORIDE CRYS ER 20 MEQ PO TBCR
40.0000 meq | EXTENDED_RELEASE_TABLET | Freq: Every day | ORAL | Status: DC
  Administered 2018-08-30: 40 meq via ORAL
  Filled 2018-08-29: qty 2

## 2018-08-29 MED ORDER — BUPIVACAINE-EPINEPHRINE 0.25% -1:200000 IJ SOLN
INTRAMUSCULAR | Status: AC
  Filled 2018-08-29: qty 1

## 2018-08-29 MED ORDER — PHENYLEPHRINE 40 MCG/ML (10ML) SYRINGE FOR IV PUSH (FOR BLOOD PRESSURE SUPPORT)
PREFILLED_SYRINGE | INTRAVENOUS | Status: AC
  Filled 2018-08-29: qty 10

## 2018-08-29 MED ORDER — ONDANSETRON HCL 4 MG PO TABS: 4.0000 mg | ORAL_TABLET | Freq: Four times a day (QID) | ORAL | Status: DC | PRN

## 2018-08-29 MED ORDER — PHENYLEPHRINE HCL 10 MG/ML IJ SOLN
INTRAMUSCULAR | Status: DC | PRN
  Administered 2018-08-29: 80 ug via INTRAVENOUS

## 2018-08-29 MED ORDER — TACROLIMUS 0.5 MG PO CAPS
0.5000 mg | ORAL_CAPSULE | Freq: Two times a day (BID) | ORAL | Status: DC
  Administered 2018-08-29 – 2018-08-30 (×2): 0.5 mg via ORAL
  Filled 2018-08-29 (×3): qty 1

## 2018-08-29 MED ORDER — FENTANYL CITRATE (PF) 100 MCG/2ML IJ SOLN
INTRAMUSCULAR | Status: DC | PRN
  Administered 2018-08-29: 100 ug via INTRAVENOUS
  Administered 2018-08-29: 50 ug via INTRAVENOUS
  Administered 2018-08-29: 100 ug via INTRAVENOUS
  Administered 2018-08-29: 50 ug via INTRAVENOUS

## 2018-08-29 MED ORDER — ROCURONIUM BROMIDE 10 MG/ML (PF) SYRINGE
PREFILLED_SYRINGE | INTRAVENOUS | Status: DC | PRN
  Administered 2018-08-29: 50 mg via INTRAVENOUS
  Administered 2018-08-29: 20 mg via INTRAVENOUS

## 2018-08-29 MED ORDER — SENNA 8.6 MG PO TABS
3.0000 | ORAL_TABLET | Freq: Every evening | ORAL | Status: DC
  Administered 2018-08-29: 25.8 mg via ORAL
  Filled 2018-08-29: qty 3

## 2018-08-29 MED ORDER — FENTANYL CITRATE (PF) 100 MCG/2ML IJ SOLN
INTRAMUSCULAR | Status: AC
  Filled 2018-08-29: qty 2

## 2018-08-29 MED ORDER — OXYCODONE HCL 5 MG PO TABS
5.0000 mg | ORAL_TABLET | Freq: Once | ORAL | Status: AC | PRN
  Administered 2018-08-29: 5 mg via ORAL

## 2018-08-29 MED ORDER — ROCURONIUM BROMIDE 50 MG/5ML IV SOSY
PREFILLED_SYRINGE | INTRAVENOUS | Status: AC
  Filled 2018-08-29: qty 5

## 2018-08-29 MED ORDER — ASPIRIN EC 325 MG PO TBEC
325.0000 mg | DELAYED_RELEASE_TABLET | Freq: Two times a day (BID) | ORAL | Status: DC
  Administered 2018-08-30: 325 mg via ORAL
  Filled 2018-08-29: qty 1

## 2018-08-29 MED ORDER — METHOCARBAMOL 500 MG PO TABS
500.0000 mg | ORAL_TABLET | Freq: Four times a day (QID) | ORAL | Status: DC | PRN
  Administered 2018-08-29: 500 mg via ORAL

## 2018-08-29 MED ORDER — METOCLOPRAMIDE HCL 5 MG/ML IJ SOLN: 5.0000 mg | Freq: Three times a day (TID) | INTRAMUSCULAR | Status: DC | PRN

## 2018-08-29 MED ORDER — KETOROLAC TROMETHAMINE 15 MG/ML IJ SOLN
15.0000 mg | Freq: Four times a day (QID) | INTRAMUSCULAR | Status: AC
  Administered 2018-08-29 – 2018-08-30 (×4): 15 mg via INTRAVENOUS
  Filled 2018-08-29 (×3): qty 1

## 2018-08-29 MED ORDER — LIDOCAINE 2% (20 MG/ML) 5 ML SYRINGE
INTRAMUSCULAR | Status: AC
  Filled 2018-08-29: qty 5

## 2018-08-29 MED ORDER — ONDANSETRON HCL 4 MG/2ML IJ SOLN: 4.0000 mg | Freq: Once | INTRAMUSCULAR | Status: DC | PRN

## 2018-08-29 MED ORDER — CYCLOBENZAPRINE HCL 10 MG PO TABS: 10.0000 mg | ORAL_TABLET | Freq: Three times a day (TID) | ORAL | Status: DC | PRN

## 2018-08-29 MED ORDER — METOCLOPRAMIDE HCL 5 MG PO TABS: 5.0000 mg | ORAL_TABLET | Freq: Three times a day (TID) | ORAL | Status: DC | PRN

## 2018-08-29 MED ORDER — ACETAMINOPHEN 325 MG PO TABS: 325.0000 mg | ORAL_TABLET | Freq: Four times a day (QID) | ORAL | Status: DC | PRN

## 2018-08-29 MED ORDER — OXYCODONE HCL 5 MG PO TABS
ORAL_TABLET | ORAL | Status: AC
  Filled 2018-08-29: qty 1

## 2018-08-29 MED ORDER — PANTOPRAZOLE SODIUM 40 MG PO TBEC
80.0000 mg | DELAYED_RELEASE_TABLET | Freq: Every day | ORAL | Status: DC
  Administered 2018-08-30: 80 mg via ORAL
  Filled 2018-08-29: qty 2

## 2018-08-29 MED ORDER — PROPOFOL 10 MG/ML IV BOLUS
INTRAVENOUS | Status: AC
  Filled 2018-08-29: qty 20

## 2018-08-29 MED ORDER — METHOCARBAMOL 1000 MG/10ML IJ SOLN
500.0000 mg | Freq: Four times a day (QID) | INTRAVENOUS | Status: DC | PRN
  Filled 2018-08-29: qty 5

## 2018-08-29 MED ORDER — 0.9 % SODIUM CHLORIDE (POUR BTL) OPTIME
TOPICAL | Status: DC | PRN
  Administered 2018-08-29: 1000 mL

## 2018-08-29 MED ORDER — CHLORHEXIDINE GLUCONATE 4 % EX LIQD
60.0000 mL | Freq: Once | CUTANEOUS | Status: DC
  Administered 2018-08-29: 4 via TOPICAL

## 2018-08-29 MED ORDER — CEFAZOLIN SODIUM-DEXTROSE 2-4 GM/100ML-% IV SOLN
2.0000 g | Freq: Four times a day (QID) | INTRAVENOUS | Status: AC
  Administered 2018-08-29 (×2): 2 g via INTRAVENOUS
  Filled 2018-08-29 (×3): qty 100

## 2018-08-29 MED ORDER — ROSUVASTATIN CALCIUM 5 MG PO TABS
5.0000 mg | ORAL_TABLET | Freq: Every evening | ORAL | Status: DC
  Administered 2018-08-29: 5 mg via ORAL
  Filled 2018-08-29: qty 1

## 2018-08-29 MED ORDER — ALUM & MAG HYDROXIDE-SIMETH 200-200-20 MG/5ML PO SUSP: 30.0000 mL | ORAL | Status: DC | PRN

## 2018-08-29 MED ORDER — CHLORHEXIDINE GLUCONATE 4 % EX LIQD: 60.0000 mL | Freq: Once | CUTANEOUS | Status: DC

## 2018-08-29 MED ORDER — OXYCODONE HCL 5 MG/5ML PO SOLN: 5.0000 mg | Freq: Once | ORAL | Status: AC | PRN

## 2018-08-29 MED ORDER — FENTANYL CITRATE (PF) 100 MCG/2ML IJ SOLN
25.0000 ug | INTRAMUSCULAR | Status: DC | PRN
  Administered 2018-08-29 (×3): 50 ug via INTRAVENOUS

## 2018-08-29 MED ORDER — BUPIVACAINE-EPINEPHRINE (PF) 0.25% -1:200000 IJ SOLN
INTRAMUSCULAR | Status: DC | PRN
  Administered 2018-08-29: 20 mL

## 2018-08-29 MED ORDER — KETOROLAC TROMETHAMINE 15 MG/ML IJ SOLN
INTRAMUSCULAR | Status: AC
  Filled 2018-08-29: qty 1

## 2018-08-29 MED ORDER — DEXAMETHASONE SODIUM PHOSPHATE 10 MG/ML IJ SOLN
INTRAMUSCULAR | Status: AC
  Filled 2018-08-29: qty 1

## 2018-08-29 MED ORDER — ONDANSETRON HCL 4 MG/2ML IJ SOLN
4.0000 mg | Freq: Four times a day (QID) | INTRAMUSCULAR | Status: DC | PRN
  Administered 2018-08-29 (×2): 4 mg via INTRAVENOUS
  Filled 2018-08-29 (×2): qty 2

## 2018-08-29 MED ORDER — LACTATED RINGERS IV SOLN
INTRAVENOUS | Status: DC
  Administered 2018-08-29 (×3): via INTRAVENOUS

## 2018-08-29 MED ORDER — DEXAMETHASONE SODIUM PHOSPHATE 10 MG/ML IJ SOLN
INTRAMUSCULAR | Status: DC | PRN
  Administered 2018-08-29: 4 mg via INTRAVENOUS

## 2018-08-29 MED ORDER — ONDANSETRON HCL 4 MG/2ML IJ SOLN
INTRAMUSCULAR | Status: AC
  Filled 2018-08-29: qty 2

## 2018-08-29 MED ORDER — MORPHINE SULFATE (PF) 2 MG/ML IV SOLN
0.5000 mg | INTRAVENOUS | Status: DC | PRN
  Administered 2018-08-29: 1 mg via INTRAVENOUS
  Filled 2018-08-29: qty 1

## 2018-08-29 MED ORDER — ACETAMINOPHEN 500 MG PO TABS
500.0000 mg | ORAL_TABLET | Freq: Four times a day (QID) | ORAL | Status: AC
  Administered 2018-08-29 – 2018-08-30 (×4): 500 mg via ORAL
  Filled 2018-08-29 (×4): qty 1

## 2018-08-29 MED ORDER — CEFAZOLIN SODIUM-DEXTROSE 2-4 GM/100ML-% IV SOLN
2.0000 g | INTRAVENOUS | Status: AC
  Administered 2018-08-29: 2 g via INTRAVENOUS
  Filled 2018-08-29: qty 100

## 2018-08-29 MED ORDER — TRANEXAMIC ACID-NACL 1000-0.7 MG/100ML-% IV SOLN
1000.0000 mg | Freq: Once | INTRAVENOUS | Status: AC
  Administered 2018-08-29: 1000 mg via INTRAVENOUS
  Filled 2018-08-29: qty 100

## 2018-08-29 SURGICAL SUPPLY — 49 items
BALL HIP CERAMIC (Hips) ×1 IMPLANT
BLADE SAW SGTL 18X1.27X75 (BLADE) ×2 IMPLANT
BLADE SAW SGTL 18X1.27X75MM (BLADE) ×1
CELLS DAT CNTRL 66122 CELL SVR (MISCELLANEOUS) ×1 IMPLANT
COVER PERINEAL POST (MISCELLANEOUS) ×3 IMPLANT
COVER SURGICAL LIGHT HANDLE (MISCELLANEOUS) ×3 IMPLANT
COVER WAND RF STERILE (DRAPES) ×3 IMPLANT
CUP GRIPTON 48MM 100 HIP (Hips) ×3 IMPLANT
DRAPE C-ARM 42X72 X-RAY (DRAPES) ×3 IMPLANT
DRAPE STERI IOBAN 125X83 (DRAPES) ×3 IMPLANT
DRAPE U-SHAPE 47X51 STRL (DRAPES) ×9 IMPLANT
DRESSING AQUACEL AG SP 3.5X10 (GAUZE/BANDAGES/DRESSINGS) ×1 IMPLANT
DRSG AQUACEL AG ADV 3.5X10 (GAUZE/BANDAGES/DRESSINGS) ×3 IMPLANT
DRSG AQUACEL AG SP 3.5X10 (GAUZE/BANDAGES/DRESSINGS) ×3
DRSG OPSITE POSTOP 4X6 (GAUZE/BANDAGES/DRESSINGS) ×3 IMPLANT
DURAPREP 26ML APPLICATOR (WOUND CARE) ×3 IMPLANT
ELECT BLADE 4.0 EZ CLEAN MEGAD (MISCELLANEOUS) ×3
ELECT REM PT RETURN 9FT ADLT (ELECTROSURGICAL) ×3
ELECTRODE BLDE 4.0 EZ CLN MEGD (MISCELLANEOUS) ×1 IMPLANT
ELECTRODE REM PT RTRN 9FT ADLT (ELECTROSURGICAL) ×1 IMPLANT
ELIMINATOR HOLE APEX DEPUY (Hips) ×3 IMPLANT
FACESHIELD WRAPAROUND (MASK) ×6 IMPLANT
GLOVE BIO SURGEON STRL SZ8 (GLOVE) ×6 IMPLANT
GLOVE BIOGEL PI IND STRL 8 (GLOVE) ×2 IMPLANT
GLOVE BIOGEL PI INDICATOR 8 (GLOVE) ×4
GOWN STRL REUS W/ TWL LRG LVL3 (GOWN DISPOSABLE) ×1 IMPLANT
GOWN STRL REUS W/ TWL XL LVL3 (GOWN DISPOSABLE) ×2 IMPLANT
GOWN STRL REUS W/TWL LRG LVL3 (GOWN DISPOSABLE) ×2
GOWN STRL REUS W/TWL XL LVL3 (GOWN DISPOSABLE) ×4
HIP BALL CERAMIC (Hips) ×3 IMPLANT
KIT BASIN OR (CUSTOM PROCEDURE TRAY) ×3 IMPLANT
KIT TURNOVER KIT B (KITS) ×3 IMPLANT
LINER ACET 32X48 (Liner) ×3 IMPLANT
MANIFOLD NEPTUNE II (INSTRUMENTS) ×3 IMPLANT
NEEDLE HYPO 22GX1.5 SAFETY (NEEDLE) ×3 IMPLANT
NS IRRIG 1000ML POUR BTL (IV SOLUTION) ×3 IMPLANT
PACK TOTAL JOINT (CUSTOM PROCEDURE TRAY) ×3 IMPLANT
PAD ARMBOARD 7.5X6 YLW CONV (MISCELLANEOUS) ×3 IMPLANT
RTRCTR WOUND ALEXIS 18CM MED (MISCELLANEOUS) ×3
STEM FEM ACTIS STD SZ2 (Stem) ×3 IMPLANT
SUT ETHIBOND NAB CT1 #1 30IN (SUTURE) ×6 IMPLANT
SUT VIC AB 1 CT1 27 (SUTURE) ×2
SUT VIC AB 1 CT1 27XBRD ANBCTR (SUTURE) ×1 IMPLANT
SUT VIC AB 2-0 CT1 27 (SUTURE) ×2
SUT VIC AB 2-0 CT1 TAPERPNT 27 (SUTURE) ×1 IMPLANT
SUT VIC AB 3-0 PS2 18 (SUTURE) ×2
SUT VIC AB 3-0 PS2 18XBRD (SUTURE) ×1 IMPLANT
SUT VLOC 180 0 24IN GS25 (SUTURE) ×3 IMPLANT
SYR 50ML LL SCALE MARK (SYRINGE) ×3 IMPLANT

## 2018-08-29 NOTE — Evaluation (Signed)
Physical Therapy Evaluation Patient Details Name: Mallory Lewis MRN: [id number] DOB: 03/25/1956 Today's Date: 08/29/2018   History of Present Illness  Pt is a 62 y/o female s/p elective R THA, direct anterior approach. PMH includes CKD, DM, HTN, anemia, and arthritis.   Clinical Impression  Pt is s/p surgery above with deficits below. Pt reporting increased "wooziness", so mobility limited to bathroom and back to chair. Verbally reviewed HEP given pt's limited activity tolerance. Will continue to follow acutely to maximize functional mobility independence and safety.     Follow Up Recommendations Follow surgeon's recommendation for DC plan and follow-up therapies;Supervision for mobility/OOB    Equipment Recommendations  None recommended by PT    Recommendations for Other Services       Precautions / Restrictions Precautions Precautions: None Restrictions Weight Bearing Restrictions: Yes RLE Weight Bearing: Weight bearing as tolerated      Mobility  Bed Mobility Overal bed mobility: Needs Assistance Bed Mobility: Supine to Sit     Supine to sit: Min assist     General bed mobility comments: MinA for RLE assist. increased time required to come to EOB  Transfers Overall transfer level: Needs assistance Equipment used: Rolling walker (2 wheeled) Transfers: Sit to/from Stand Sit to Stand: Min assist         General transfer comment: Min A for lift assist and steadying assist.   Ambulation/Gait Ambulation/Gait assistance: Min guard Gait Distance (Feet): 20 Feet Assistive device: Rolling walker (2 wheeled) Gait Pattern/deviations: Step-to pattern;Decreased step length - right;Decreased step length - left;Decreased weight shift to right;Antalgic Gait velocity: Decreased    General Gait Details: Slow, antalgic gait. Distance limited to bathroom and back as pt reporting "wooziness" during ambulation. Cues for sequencing using RW.   Stairs             Wheelchair Mobility    Modified Rankin (Stroke Patients Only)       Balance Overall balance assessment: Needs assistance Sitting-balance support: No upper extremity supported;Feet supported Sitting balance-Leahy Scale: Fair     Standing balance support: Bilateral upper extremity supported;During functional activity Standing balance-Leahy Scale: Poor Standing balance comment: Reliant on UE support                              Pertinent Vitals/Pain Pain Assessment: Faces Faces Pain Scale: Hurts even more Pain Location: R hip  Pain Descriptors / Indicators: Aching;Operative site guarding Pain Intervention(s): Monitored during session;Limited activity within patient's tolerance;Repositioned    Home Living Family/patient expects to be discharged to:: Private residence Living Arrangements: Spouse/significant other Available Help at Discharge: Family;Available 24 hours/day Type of Home: House Home Access: Stairs to enter Entrance Stairs-Rails: None Entrance Stairs-Number of Steps: 2 Home Layout: One level Home Equipment: Walker - 2 wheels;Cane - single point;Bedside commode      Prior Function Level of Independence: Independent               Hand Dominance        Extremity/Trunk Assessment   Upper Extremity Assessment Upper Extremity Assessment: Overall WFL for tasks assessed    Lower Extremity Assessment Lower Extremity Assessment: RLE deficits/detail RLE Deficits / Details: Deficits consistent with post op pain and weakness.     Cervical / Trunk Assessment Cervical / Trunk Assessment: Normal  Communication   Communication: No difficulties  Cognition Arousal/Alertness: Awake/alert Behavior During Therapy: WFL for tasks assessed/performed Overall Cognitive Status: Within Functional Limits for tasks assessed  General Comments General comments (skin integrity, edema, etc.): Pt's  husband present throughout session. Verbally reviewed supine HEP including quad sets, ankle pumps, and heel slides     Exercises     Assessment/Plan    PT Assessment Patient needs continued PT services  PT Problem List Decreased strength;Decreased balance;Decreased activity tolerance;Decreased mobility;Decreased range of motion;Decreased knowledge of use of DME;Pain       PT Treatment Interventions DME instruction;Gait training;Stair training;Functional mobility training;Therapeutic activities;Therapeutic exercise;Balance training;Patient/family education    PT Goals (Current goals can be found in the Care Plan section)  Acute Rehab PT Goals Patient Stated Goal: to go home tomorrow PT Goal Formulation: With patient Time For Goal Achievement: 09/12/18 Potential to Achieve Goals: Good    Frequency 7X/week   Barriers to discharge        Co-evaluation               AM-PAC PT "6 Clicks" Daily Activity  Outcome Measure Difficulty turning over in bed (including adjusting bedclothes, sheets and blankets)?: A Little Difficulty moving from lying on back to sitting on the side of the bed? : Unable Difficulty sitting down on and standing up from a chair with arms (e.g., wheelchair, bedside commode, etc,.)?: Unable Help needed moving to and from a bed to chair (including a wheelchair)?: A Little Help needed walking in hospital room?: A Little Help needed climbing 3-5 steps with a railing? : A Lot 6 Click Score: 13    End of Session Equipment Utilized During Treatment: Gait belt Activity Tolerance: Treatment limited secondary to medical complications (Comment)("wooziness" ) Patient left: in chair;with call bell/phone within reach;with family/visitor present Nurse Communication: Mobility status PT Visit Diagnosis: Muscle weakness (generalized) (M62.81);Unsteadiness on feet (R26.81);Pain Pain - Right/Left: Right Pain - part of body: Hip    Time: 8828-0034 PT Time Calculation  (min) (ACUTE ONLY): 26 min   Charges:   PT Evaluation $PT Eval Low Complexity: 1 Low PT Treatments $Therapeutic Activity: 8-22 mins        Leighton Ruff, PT, DPT  Acute Rehabilitation Services  Pager: [phone] Office: [phone]   Rudean Hitt 08/29/2018, 6:36 PM

## 2018-08-29 NOTE — Anesthesia Procedure Notes (Signed)
Procedure Name: Intubation Date/Time: 08/29/2018 10:34 AM Performed by: Inda Coke, CRNA Pre-anesthesia Checklist: Patient identified, Emergency Drugs available, Suction available and Patient being monitored Patient Re-evaluated:Patient Re-evaluated prior to induction Oxygen Delivery Method: Circle System Utilized Preoxygenation: Pre-oxygenation with 100% oxygen Induction Type: IV induction Ventilation: Mask ventilation without difficulty Laryngoscope Size: Mac and 3 Grade View: Grade I Tube type: Oral Tube size: 7.0 mm Number of attempts: 1 Airway Equipment and Method: Stylet and Oral airway Placement Confirmation: ETT inserted through vocal cords under direct vision,  positive ETCO2 and breath sounds checked- equal and bilateral Secured at: 22 cm Tube secured with: Tape Dental Injury: Teeth and Oropharynx as per pre-operative assessment

## 2018-08-29 NOTE — Op Note (Signed)

## 2018-08-29 NOTE — Interval H&P Note (Signed)
History and Physical Interval Note:  08/29/2018 9:20 AM  Mallory Lewis  has presented today for surgery, with the diagnosis of RIGHT HIP DEGENERATIVE JOINT DISEASE  The various methods of treatment have been discussed with the patient and family. After consideration of risks, benefits and other options for treatment, the patient has consented to  Procedure(s): TOTAL HIP ARTHROPLASTY ANTERIOR APPROACH (Right) as a surgical intervention .  The patient's history has been reviewed, patient examined, no change in status, stable for surgery.  I have reviewed the patient's chart and labs.  Questions were answered to the patient's satisfaction.     Lakesa Coste G

## 2018-08-29 NOTE — Anesthesia Postprocedure Evaluation (Signed)
Anesthesia Post Note  Patient: Nillie Bartolotta  Procedure(s) Performed: TOTAL HIP ARTHROPLASTY ANTERIOR APPROACH (Right Hip)     Patient location during evaluation: PACU Anesthesia Type: General Level of consciousness: awake and alert Pain management: pain level controlled Vital Signs Assessment: post-procedure vital signs reviewed and stable Respiratory status: spontaneous breathing, nonlabored ventilation and respiratory function stable Cardiovascular status: blood pressure returned to baseline and stable Postop Assessment: no apparent nausea or vomiting Anesthetic complications: no    Last Vitals:  Vitals:   08/29/18 1235 08/29/18 1421  BP: (!) 146/77 129/77  Pulse: 77 76  Resp: 15   Temp: (!) 36.2 C 36.7 C  SpO2: 98% 94%    Last Pain:  Vitals:   08/29/18 1441  TempSrc:   PainSc: 5                  Lidia Collum

## 2018-08-29 NOTE — Transfer of Care (Signed)
Immediate Anesthesia Transfer of Care Note  Patient: Mallory Lewis  Procedure(s) Performed: TOTAL HIP ARTHROPLASTY ANTERIOR APPROACH (Right Hip)  Patient Location: PACU  Anesthesia Type:General  Level of Consciousness: awake and alert   Airway & Oxygen Therapy: Patient Spontanous Breathing and Patient connected to nasal cannula oxygen  Post-op Assessment: Report given to RN and Post -op Vital signs reviewed and stable  Post vital signs: Reviewed and stable  Last Vitals:  Vitals Value Taken Time  BP 146/77 08/29/2018 12:35 PM  Temp    Pulse 76 08/29/2018 12:36 PM  Resp 13 08/29/2018 12:36 PM  SpO2 98 % 08/29/2018 12:36 PM  Vitals shown include unvalidated device data.  Last Pain:  Vitals:   08/29/18 0821  PainSc: 1       Patients Stated Pain Goal: 2 (54/56/25 6389)  Complications: No apparent anesthesia complications

## 2018-08-30 ENCOUNTER — Encounter (HOSPITAL_COMMUNITY): Payer: Self-pay | Admitting: Orthopaedic Surgery

## 2018-08-30 MED ORDER — TIZANIDINE HCL 4 MG PO TABS: 4.0000 mg | ORAL_TABLET | Freq: Four times a day (QID) | ORAL | 1 refills | Status: DC | PRN

## 2018-08-30 MED ORDER — HYDROCODONE-ACETAMINOPHEN 5-325 MG PO TABS: 1.0000 | ORAL_TABLET | Freq: Four times a day (QID) | ORAL | 0 refills | Status: DC | PRN

## 2018-08-30 MED ORDER — ASPIRIN 325 MG PO TBEC: 325.0000 mg | DELAYED_RELEASE_TABLET | Freq: Two times a day (BID) | ORAL | 0 refills | Status: DC

## 2018-08-30 NOTE — Progress Notes (Signed)
Subjective: 1 Day Post-Op Procedure(s) (LRB): TOTAL HIP ARTHROPLASTY ANTERIOR APPROACH (Right)  Patinet is feelign great. She would like to try to go home today after PT.  Activity level:  wbat Diet tolerance:  ok Voiding:  ok Patient reports pain as mild.    Objective: Vital signs in last 24 hours: Temp:  [97.2 F (36.2 C)-98.7 F (37.1 C)] 98.7 F (37.1 C) (11/20 0329) Pulse Rate:  [76-90] 89 (11/20 0329) Resp:  [15-18] 18 (11/20 0329) BP: (125-159)/(60-79) 125/60 (11/20 0329) SpO2:  [94 %-98 %] 94 % (11/20 0329) Weight:  [77.1 kg] 77.1 kg (11/19 0806)  Labs: No results for input(s): HGB in the last 72 hours. No results for input(s): WBC, RBC, HCT, PLT in the last 72 hours. No results for input(s): NA, K, CL, CO2, BUN, CREATININE, GLUCOSE, CALCIUM in the last 72 hours. No results for input(s): LABPT, INR in the last 72 hours.  Physical Exam:  Neurologically intact ABD soft Neurovascular intact Sensation intact distally Intact pulses distally Dorsiflexion/Plantar flexion intact Incision: dressing C/D/I and no drainage No cellulitis present Compartment soft  Assessment/Plan:  1 Day Post-Op Procedure(s) (LRB): TOTAL HIP ARTHROPLASTY ANTERIOR APPROACH (Right) Advance diet Up with therapy Discharge home with home health today after PT if doing well. Continue on ASA 325mg  BID x 2 weeks post op. Follow up in office 2 weeks post op.  Mallory Lewis, Larwance Sachs 08/30/2018, 7:41 AM

## 2018-08-30 NOTE — Discharge Summary (Signed)
Patient ID: Mallory Lewis MRN: [id number] DOB/AGE: 10-17-1955 62 y.o.  Admit date: 08/29/2018 Discharge date: 08/30/2018  Admission Diagnoses:  Principal Problem:   Primary localized osteoarthritis of right hip Active Problems:   Primary osteoarthritis of right hip   Discharge Diagnoses:  Same  Past Medical History:  Diagnosis Date  . Anemia   . Arthritis   . Chronic kidney disease   . Diabetes mellitus without complication ([place])   . GERD (gastroesophageal reflux disease)   . Heart murmur   . Hypertension     Surgeries: Procedure(s): TOTAL HIP ARTHROPLASTY ANTERIOR APPROACH on 08/29/2018   Consultants:   Discharged Condition: Improved  Hospital Course: Mallory Lewis is an 62 y.o. female who was admitted 08/29/2018 for operative treatment ofPrimary localized osteoarthritis of right hip. Patient has severe unremitting pain that affects sleep, daily activities, and work/hobbies. After pre-op clearance the patient was taken to the operating room on 08/29/2018 and underwent  Procedure(s): TOTAL HIP ARTHROPLASTY ANTERIOR APPROACH.    Patient was given perioperative antibiotics:  Anti-infectives (From admission, onward)   Start     Dose/Rate Route Frequency Ordered Stop   08/29/18 1600  ceFAZolin (ANCEF) IVPB 2g/100 mL premix     2 g 200 mL/hr over 30 Minutes Intravenous Every 6 hours 08/29/18 1345 08/29/18 2142   08/29/18 0830  ceFAZolin (ANCEF) IVPB 2g/100 mL premix     2 g 200 mL/hr over 30 Minutes Intravenous On call to O.R. 08/29/18 0816 08/29/18 1035   08/29/18 0830  ceFAZolin (ANCEF) IVPB 2g/100 mL premix  Status:  Discontinued     2 g 200 mL/hr over 30 Minutes Intravenous On call to O.R. 08/29/18 0817 08/29/18 0820       Patient was given sequential compression devices, early ambulation, and chemoprophylaxis to prevent DVT.  Patient benefited maximally from hospital stay and there were no complications.    Recent vital signs:  Patient Vitals for  the past 24 hrs:  BP Temp Temp src Pulse Resp SpO2 Height Weight  08/30/18 0329 125/60 98.7 F (37.1 C) Oral 89 18 94 % - -  08/29/18 2013 137/68 98.6 F (37 C) Oral 90 16 94 % - -  08/29/18 1421 129/77 98 F (36.7 C) Oral 76 - 94 % - -  08/29/18 1235 (!) 146/77 (!) 97.2 F (36.2 C) - 77 15 98 % - -  08/29/18 4696 - - - - - - 5\' 6"  (1.676 m) -  08/29/18 0806 (!) 159/79 (!) 97.5 F (36.4 C) - 77 17 98 % - 77.1 kg     Recent laboratory studies: No results for input(s): WBC, HGB, HCT, PLT, NA, K, CL, CO2, BUN, CREATININE, GLUCOSE, INR, CALCIUM in the last 72 hours.  Invalid input(s): PT, 2   Discharge Medications:   Allergies as of 08/30/2018      Reactions   Ivp Dye [iodinated Diagnostic Agents] Hives      Medication List    TAKE these medications   acetaminophen 650 MG CR tablet Commonly known as:  TYLENOL Take 1,300 mg by mouth 2 (two) times daily as needed for pain.   aspirin 325 MG EC tablet Take 1 tablet (325 mg total) by mouth 2 (two) times daily after a meal. What changed:    medication strength  how much to take  when to take this   Biotin 5 MG Caps Take 5 mg by mouth every evening.   cyclobenzaprine 10 MG tablet Commonly known as:  FLEXERIL Take 10  mg by mouth 3 (three) times daily as needed for muscle spasms.   furosemide 40 MG tablet Commonly known as:  LASIX Take 40 mg by mouth daily.   HYDROcodone-acetaminophen 5-325 MG tablet Commonly known as:  NORCO/VICODIN Take 1-2 tablets by mouth every 6 (six) hours as needed for moderate pain (pain score 4-6).   KLOR-CON M20 20 MEQ tablet Generic drug:  potassium chloride SA Take 40 mEq by mouth daily.   losartan 100 MG tablet Commonly known as:  COZAAR Take 100 mg by mouth every evening.   omeprazole 40 MG capsule Commonly known as:  PRILOSEC Take 40 mg by mouth daily.   rosuvastatin 5 MG tablet Commonly known as:  CRESTOR Take 5 mg by mouth every evening.   senna 8.6 MG tablet Commonly  known as:  SENOKOT Take 3 tablets by mouth every evening.   SLOW RELEASE IRON 45 MG Tbcr Generic drug:  Ferrous Sulfate Dried Take 45 tablets by mouth 2 (two) times daily.   tacrolimus 0.5 MG capsule Commonly known as:  PROGRAF Take 0.5 mg by mouth 2 (two) times daily.   tiZANidine 4 MG tablet Commonly known as:  ZANAFLEX Take 1 tablet (4 mg total) by mouth every 6 (six) hours as needed.   tretinoin 0.025 % cream Commonly known as:  RETIN-A Apply 1 application topically daily as needed (blemishes).   Vitamin D-3 125 MCG (5000 UT) Tabs Take 5,000 tablets by mouth every evening.            Durable Medical Equipment  (From admission, onward)         Start     Ordered   08/29/18 1346  DME Walker rolling  Once    Question:  Patient needs a walker to treat with the following condition  Answer:  Primary osteoarthritis of right hip   08/29/18 1345   08/29/18 1346  DME 3 n 1  Once     08/29/18 1345   08/29/18 1346  DME Bedside commode  Once    Question:  Patient needs a bedside commode to treat with the following condition  Answer:  Primary osteoarthritis of right hip   08/29/18 1345          Diagnostic Studies: Dg Chest 2 View  Result Date: 08/18/2018 CLINICAL DATA:  Right hip surgery former smoker history of kidney disease EXAM: CHEST - 2 VIEW COMPARISON:  10/20/2015 FINDINGS: No acute airspace disease or pleural effusion. Stable cardiomediastinal silhouette. No pneumothorax. IMPRESSION: No active cardiopulmonary disease. Electronically Signed   By: Donavan Foil M.D.   On: 08/18/2018 14:19   Dg C-arm 1-60 Min  Result Date: 08/29/2018 CLINICAL DATA:  Anterior right hip arthroplasty EXAM: DG C-ARM 61-120 MIN COMPARISON:  None. FINDINGS: C-arm fluoroscopy was provided during right hip replacement. Fluoroscopy time of 35 seconds was recorded. IMPRESSION: C-arm fluoroscopy provided. Electronically Signed   By: Ivar Drape M.D.   On: 08/29/2018 12:26   Dg Hip Operative  Unilat W Or W/o Pelvis Right  Result Date: 08/29/2018 CLINICAL DATA:  Anterior right total hip replacement EXAM: OPERATIVE right HIP (WITH PELVIS IF PERFORMED)  VIEWS TECHNIQUE: Fluoroscopic spot image(s) were submitted for interpretation post-operatively. COMPARISON:  Right hip films of 12/03/2016. FINDINGS: Two C-arm spot films were returned. These images demonstrate placement of acetabular and femoral components of the right hip replacement which appear to be in good position. No complicating features are seen. IMPRESSION: Anterior right hip replacement.  No complicating features. Electronically Signed  By: Ivar Drape M.D.   On: 08/29/2018 12:27    Disposition: Discharge disposition: 01-Home or Self Care       Discharge Instructions    Call MD / Call 911   Complete by:  As directed    If you experience chest pain or shortness of breath, CALL 911 and be transported to the hospital emergency room.  If you develope a fever above 101 F, pus (white drainage) or increased drainage or redness at the wound, or calf pain, call your surgeon's office.   Constipation Prevention   Complete by:  As directed    Drink plenty of fluids.  Prune juice may be helpful.  You may use a stool softener, such as Colace (over the counter) 100 mg twice a day.  Use MiraLax (over the counter) for constipation as needed.   Diet - low sodium heart healthy   Complete by:  As directed    Discharge instructions   Complete by:  As directed    INSTRUCTIONS AFTER JOINT REPLACEMENT   Remove items at home which could result in a fall. This includes throw rugs or furniture in walking pathways ICE to the affected joint every three hours while awake for 30 minutes at a time, for at least the first 3-5 days, and then as needed for pain and swelling.  Continue to use ice for pain and swelling. You may notice swelling that will progress down to the foot and ankle.  This is normal after surgery.  Elevate your leg when you are not  up walking on it.   Continue to use the breathing machine you got in the hospital (incentive spirometer) which will help keep your temperature down.  It is common for your temperature to cycle up and down following surgery, especially at night when you are not up moving around and exerting yourself.  The breathing machine keeps your lungs expanded and your temperature down.   DIET:  As you were doing prior to hospitalization, we recommend a well-balanced diet.  DRESSING / WOUND CARE / SHOWERING  You may shower 3 days after surgery, but keep the wounds dry during showering.  You may use an occlusive plastic wrap (Press'n Seal for example), NO SOAKING/SUBMERGING IN THE BATHTUB.  If the bandage gets wet, change with a clean dry gauze.  If the incision gets wet, pat the wound dry with a clean towel.  ACTIVITY  Increase activity slowly as tolerated, but follow the weight bearing instructions below.   No driving for 6 weeks or until further direction given by your physician.  You cannot drive while taking narcotics.  No lifting or carrying greater than 10 lbs. until further directed by your surgeon. Avoid periods of inactivity such as sitting longer than an hour when not asleep. This helps prevent blood clots.  You may return to work once you are authorized by your doctor.     WEIGHT BEARING   Weight bearing as tolerated with assist device (walker, cane, etc) as directed, use it as long as suggested by your surgeon or therapist, typically at least 4-6 weeks.   EXERCISES  Results after joint replacement surgery are often greatly improved when you follow the exercise, range of motion and muscle strengthening exercises prescribed by your doctor. Safety measures are also important to protect the joint from further injury. Any time any of these exercises cause you to have increased pain or swelling, decrease what you are doing until you are comfortable again and then  slowly increase them. If you have  problems or questions, call your caregiver or physical therapist for advice.   Rehabilitation is important following a joint replacement. After just a few days of immobilization, the muscles of the leg can become weakened and shrink (atrophy).  These exercises are designed to build up the tone and strength of the thigh and leg muscles and to improve motion. Often times heat used for twenty to thirty minutes before working out will loosen up your tissues and help with improving the range of motion but do not use heat for the first two weeks following surgery (sometimes heat can increase post-operative swelling).   These exercises can be done on a training (exercise) mat, on the floor, on a table or on a bed. Use whatever works the best and is most comfortable for you.    Use music or television while you are exercising so that the exercises are a pleasant break in your day. This will make your life better with the exercises acting as a break in your routine that you can look forward to.   Perform all exercises about fifteen times, three times per day or as directed.  You should exercise both the operative leg and the other leg as well.   Exercises include:   Quad Sets - Tighten up the muscle on the front of the thigh (Quad) and hold for 5-10 seconds.   Straight Leg Raises - With your knee straight (if you were given a brace, keep it on), lift the leg to 60 degrees, hold for 3 seconds, and slowly lower the leg.  Perform this exercise against resistance later as your leg gets stronger.  Leg Slides: Lying on your back, slowly slide your foot toward your buttocks, bending your knee up off the floor (only go as far as is comfortable). Then slowly slide your foot back down until your leg is flat on the floor again.  Angel Wings: Lying on your back spread your legs to the side as far apart as you can without causing discomfort.  Hamstring Strength:  Lying on your back, push your heel against the floor with your  leg straight by tightening up the muscles of your buttocks.  Repeat, but this time bend your knee to a comfortable angle, and push your heel against the floor.  You may put a pillow under the heel to make it more comfortable if necessary.   A rehabilitation program following joint replacement surgery can speed recovery and prevent re-injury in the future due to weakened muscles. Contact your doctor or a physical therapist for more information on knee rehabilitation.    CONSTIPATION  Constipation is defined medically as fewer than three stools per week and severe constipation as less than one stool per week.  Even if you have a regular bowel pattern at home, your normal regimen is likely to be disrupted due to multiple reasons following surgery.  Combination of anesthesia, postoperative narcotics, change in appetite and fluid intake all can affect your bowels.   YOU MUST use at least one of the following options; they are listed in order of increasing strength to get the job done.  They are all available over the counter, and you may need to use some, POSSIBLY even all of these options:    Drink plenty of fluids (prune juice may be helpful) and high fiber foods Colace 100 mg by mouth twice a day  Senokot for constipation as directed and as needed Dulcolax (bisacodyl), take  with full glass of water  Miralax (polyethylene glycol) once or twice a day as needed.  If you have tried all these things and are unable to have a bowel movement in the first 3-4 days after surgery call either your surgeon or your primary doctor.    If you experience loose stools or diarrhea, hold the medications until you stool forms back up.  If your symptoms do not get better within 1 week or if they get worse, check with your doctor.  If you experience "the worst abdominal pain ever" or develop nausea or vomiting, please contact the office immediately for further recommendations for treatment.   ITCHING:  If you experience  itching with your medications, try taking only a single pain pill, or even half a pain pill at a time.  You can also use Benadryl over the counter for itching or also to help with sleep.   TED HOSE STOCKINGS:  Use stockings on both legs until for at least 2 weeks or as directed by physician office. They may be removed at night for sleeping.  MEDICATIONS:  See your medication summary on the "After Visit Summary" that nursing will review with you.  You may have some home medications which will be placed on hold until you complete the course of blood thinner medication.  It is important for you to complete the blood thinner medication as prescribed.  PRECAUTIONS:  If you experience chest pain or shortness of breath - call 911 immediately for transfer to the hospital emergency department.   If you develop a fever greater that 101 F, purulent drainage from wound, increased redness or drainage from wound, foul odor from the wound/dressing, or calf pain - CONTACT YOUR SURGEON.                                                   FOLLOW-UP APPOINTMENTS:  If you do not already have a post-op appointment, please call the office for an appointment to be seen by your surgeon.  Guidelines for how soon to be seen are listed in your "After Visit Summary", but are typically between 1-4 weeks after surgery.  OTHER INSTRUCTIONS:   Knee Replacement:  Do not place pillow under knee, focus on keeping the knee straight while resting. CPM instructions: 0-90 degrees, 2 hours in the morning, 2 hours in the afternoon, and 2 hours in the evening. Place foam block, curve side up under heel at all times except when in CPM or when walking.  DO NOT modify, tear, cut, or change the foam block in any way.  MAKE SURE YOU:  Understand these instructions.  Get help right away if you are not doing well or get worse.    Thank you for letting us be a part of your medical care team.  It is a privilege we respect greatly.  We hope these  instructions will help you stay on track for a fast and full recovery!   Increase activity slowly as tolerated   Complete by:  As directed       Follow-up Information    Melrose Nakayama, MD. Schedule an appointment as soon as possible for a visit in 2 weeks.   Specialty:  Orthopedic Surgery Contact information: [place] [address] [phone]            Signed:  Matthews Franks, Larwance Sachs 08/30/2018, 7:45 AM

## 2018-08-30 NOTE — Progress Notes (Signed)
Physical Therapy Treatment Patient Details Name: Mallory Lewis MRN: [id number] DOB: 16-Dec-1955 Today's Date: 08/30/2018    History of Present Illness Pt is a 62 y/o female s/p elective R THA, direct anterior approach. PMH includes CKD, DM, HTN, anemia, and arthritis.     PT Comments    Patient is making good progress with PT.  From a mobility standpoint anticipate patient will be ready for DC home when medically ready.    Follow Up Recommendations  Follow surgeon's recommendation for DC plan and follow-up therapies;Supervision for mobility/OOB     Equipment Recommendations  None recommended by PT    Recommendations for Other Services       Precautions / Restrictions Precautions Precautions: None Precaution Comments: direct anterior  Restrictions Weight Bearing Restrictions: Yes RLE Weight Bearing: Weight bearing as tolerated    Mobility  Bed Mobility               General bed mobility comments: pt sitting EOB upon arrival; pt educated on ascending step to get into high bed  Transfers Overall transfer level: Modified independent Equipment used: Rolling walker (2 wheeled) Transfers: Sit to/from Stand           General transfer comment: use of RW upon standing; safe hand placement demonstrated  Ambulation/Gait Ambulation/Gait assistance: Supervision Gait Distance (Feet): 250 Feet Assistive device: Rolling walker (2 wheeled) Gait Pattern/deviations: Decreased weight shift to right;Antalgic;Step-through pattern;Decreased stride length Gait velocity: Decreased    General Gait Details: mild antalgic gait but steady; pt with step through pattern and improving step length symmetry with distance; cues for R heel strike/toe off and proximity to RW   Stairs Stairs: Yes Stairs assistance: Min guard;Min assist Stair Management: No rails;Step to pattern;Backwards;With walker;One rail Left;With cane Number of Stairs: (2 steps X 2 trials) General stair  comments: cues for sequencing and technique; assist to steady   Wheelchair Mobility    Modified Rankin (Stroke Patients Only)       Balance Overall balance assessment: Needs assistance Sitting-balance support: No upper extremity supported;Feet supported Sitting balance-Leahy Scale: Fair     Standing balance support: Bilateral upper extremity supported;During functional activity Standing balance-Leahy Scale: Poor Standing balance comment: pt is able to static stand without UE support                            Cognition Arousal/Alertness: Awake/alert Behavior During Therapy: WFL for tasks assessed/performed Overall Cognitive Status: Within Functional Limits for tasks assessed                                        Exercises Total Joint Exercises Hip ABduction/ADduction: AROM;Right;10 reps;Standing Long Arc Quad: AROM;Right;10 reps;Seated Knee Flexion: AROM;Right;10 reps;Standing Marching in Standing: AROM;Right;10 reps;Standing Standing Hip Extension: AROM;Right;10 reps;Standing    General Comments        Pertinent Vitals/Pain Pain Assessment: 0-10 Pain Score: 2  Pain Location: R hip  Pain Descriptors / Indicators: Discomfort;Guarding Pain Intervention(s): Limited activity within patient's tolerance;Monitored during session;Premedicated before session;Repositioned    Home Living                      Prior Function            PT Goals (current goals can now be found in the care plan section) Progress towards PT goals: Progressing toward goals  Frequency    7X/week      PT Plan Current plan remains appropriate    Co-evaluation              AM-PAC PT "6 Clicks" Daily Activity  Outcome Measure  Difficulty turning over in bed (including adjusting bedclothes, sheets and blankets)?: A Little Difficulty moving from lying on back to sitting on the side of the bed? : A Little Difficulty sitting down on and  standing up from a chair with arms (e.g., wheelchair, bedside commode, etc,.)?: Unable Help needed moving to and from a bed to chair (including a wheelchair)?: A Little Help needed walking in hospital room?: A Little Help needed climbing 3-5 steps with a railing? : A Little 6 Click Score: 16    End of Session Equipment Utilized During Treatment: Gait belt Activity Tolerance: Patient tolerated treatment well Patient left: with call bell/phone within reach;with family/visitor present;Other (comment)(pt sitting EOB) Nurse Communication: Mobility status PT Visit Diagnosis: Muscle weakness (generalized) (M62.81);Unsteadiness on feet (R26.81);Pain Pain - Right/Left: Right Pain - part of body: Hip     Time: 0962-8366 PT Time Calculation (min) (ACUTE ONLY): 26 min  Charges:  $Gait Training: 8-22 mins $Therapeutic Exercise: 8-22 mins                     Earney Navy, PTA Acute Rehabilitation Services Pager: [phone] Office: [phone]     Darliss Cheney 08/30/2018, 11:49 AM

## 2019-03-08 ENCOUNTER — Encounter: Payer: Self-pay | Admitting: Cardiology

## 2019-03-08 ENCOUNTER — Ambulatory Visit (INDEPENDENT_AMBULATORY_CARE_PROVIDER_SITE_OTHER): Admitting: Cardiology

## 2019-03-08 VITALS — BP 113/73 | Ht 66.0 in | Wt 175.0 lb

## 2019-03-08 DIAGNOSIS — N051 Unspecified nephritic syndrome with focal and segmental glomerular lesions: Secondary | ICD-10-CM | POA: Diagnosis not present

## 2019-03-08 DIAGNOSIS — I10 Essential (primary) hypertension: Secondary | ICD-10-CM | POA: Diagnosis not present

## 2019-03-08 DIAGNOSIS — E119 Type 2 diabetes mellitus without complications: Secondary | ICD-10-CM | POA: Diagnosis not present

## 2019-03-08 DIAGNOSIS — E782 Mixed hyperlipidemia: Secondary | ICD-10-CM | POA: Diagnosis not present

## 2019-03-08 NOTE — Progress Notes (Signed)
Virtual Visit via Video Note: This visit type was conducted due to national recommendations for restrictions regarding the COVID-19 Pandemic (e.g. social distancing).  This format is felt to be most appropriate for this patient at this time.  All issues noted in this document were discussed and addressed.  No physical exam was performed (except for noted visual exam findings with Telehealth visits).  The patient has consented to conduct a Telehealth visit and understands insurance will be billed.   I connected with@, on 03/08/19 at  by a video enabled telemedicine application and verified that I am speaking with the correct person using two identifiers.   I discussed the limitations of evaluation and management by telemedicine and the availability of in person appointments. The patient expressed understanding and agreed to proceed.   I have discussed with patient regarding the safety during COVID Pandemic and steps and precautions to be taken including social distancing, frequent hand wash and use of detergent soap, gels with the patient. I asked the patient to avoid touching mouth, nose, eyes, ears with the hands. I encouraged regular walking around the neighborhood and exercise and regular diet, as long as social distancing can be maintained.  Primary Physician/Referring:  Camille Bal, PA-C  Patient ID: Mallory Lewis, female    DOB: [birth year], 63 y.o.   MRN: [id number]  Chief Complaint  Patient presents with  . Hypertension  . Follow-up   HPI: Mallory Lewis  is a 63 y.o. female  with type II diabetes mellitus, GERD, biopsy-proven nephrotic syndrome (FSGS NOS), on tacrolimus therapy.  She has been doing well from nephrotic syndrome standpoint and has had improvement in her proteinuria. She denies any chest pain, orthopnea, PND, pre-syncope, syncope, TIA. She has occasional shortness of breath, leg edema. She is scheuled to undergo hip replacement surgery tomorrow.  In  April 2020, had acute exacerbation of FSGS, had to use lasix for 4-5 days and fortunately no proteinuria. Tacrolimus increased to 1 mg twice daily.  Otherwise she is presently doing well.  No chest pain.  Past Medical History:  Diagnosis Date  . Anemia   . Arthritis   . Chronic kidney disease   . Diabetes mellitus without complication ([place])   . GERD (gastroesophageal reflux disease)   . Heart murmur   . Hypertension     Past Surgical History:  Procedure Laterality Date  . ABDOMINAL HYSTERECTOMY    . APPENDECTOMY    . mohs procedure    . PELVIC FLOOR REPAIR    . TOTAL HIP ARTHROPLASTY Right 08/29/2018   Procedure: TOTAL HIP ARTHROPLASTY ANTERIOR APPROACH;  Surgeon: Melrose Nakayama, MD;  Location: [place];  Service: Orthopedics;  Laterality: Right;    Social History   Socioeconomic History  . Marital status: Married    Spouse name: Not on file  . Number of children: 2  . Years of education: Not on file  . Highest education level: Not on file  Occupational History  . Not on file  Social Needs  . Financial resource strain: Not on file  . Food insecurity:    Worry: Not on file    Inability: Not on file  . Transportation needs:    Medical: Not on file    Non-medical: Not on file  Tobacco Use  . Smoking status: Former Smoker    Packs/day: 0.25    Types: Cigarettes    Last attempt to quit: 01/04/2014    Years since quitting: 5.1  . Smokeless tobacco: Never Used  Substance and Sexual Activity  . Alcohol use: Yes    Frequency: Never    Comment: occ  . Drug use: Never  . Sexual activity: Not on file  Lifestyle  . Physical activity:    Days per week: Not on file    Minutes per session: Not on file  . Stress: Not on file  Relationships  . Social connections:    Talks on phone: Not on file    Gets together: Not on file    Attends religious service: Not on file    Active member of club or organization: Not on file    Attends meetings of clubs or organizations: Not on  file    Relationship status: Not on file  . Intimate partner violence:    Fear of current or ex partner: Not on file    Emotionally abused: Not on file    Physically abused: Not on file    Forced sexual activity: Not on file  Other Topics Concern  . Not on file  Social History Narrative  . Not on file   Review of Systems  Constitution: Negative for chills, decreased appetite, malaise/fatigue and weight gain.  Cardiovascular: Positive for leg swelling (stable). Negative for chest pain, dyspnea on exertion and syncope.  Endocrine: Negative for cold intolerance.  Hematologic/Lymphatic: Does not bruise/bleed easily.  Musculoskeletal: Positive for joint pain (left hip DJD, S/P right hip arthroplasty). Negative for joint swelling.  Gastrointestinal: Negative for abdominal pain, anorexia, change in bowel habit, hematochezia and melena.  Neurological: Negative for headaches and light-headedness.  Psychiatric/Behavioral: Negative for depression and substance abuse.  All other systems reviewed and are negative.     Objective  Blood pressure 113/73, height 5\' 6"  (1.676 m), weight 175 lb (79.4 kg). Body mass index is 28.25 kg/m.   Physical exam not performed or limited due to virtual visit.  Patient appeared to be in no distress, Neck was supple, respiration was not labored.  Please see exam details from prior visit is as below.  Physical Exam  Constitutional: She appears well-developed and well-nourished. No distress.  HENT:  Head: Atraumatic.  Eyes: Conjunctivae are normal.  Neck: Neck supple. No JVD present. No thyromegaly present.  Cardiovascular: Normal rate, regular rhythm, S1 normal, S2 normal and intact distal pulses. Exam reveals no gallop.  Murmur heard. Pulmonary/Chest: Effort normal and breath sounds normal.  Abdominal: Soft. Bowel sounds are normal.  Musculoskeletal: Normal range of motion.        General: No edema.  Neurological: She is alert.  Skin: Skin is warm and  dry.  Psychiatric: She has a normal mood and affect.   Radiology: No results found.  Laboratory examination:   Labs 12/05/2017: Total cholesterol 333, triglycerides 255, HDL 48, LDL 234. Non-HDL cholesterol 285.  CMP Latest Ref Rng & Units 08/18/2018  Glucose 70 - 99 mg/dL 98  BUN 8 - 23 mg/dL 11  Creatinine 0.44 - 1.00 mg/dL 0.72  Sodium 135 - 145 mmol/L 139  Potassium 3.5 - 5.1 mmol/L 3.4(L)  Chloride 98 - 111 mmol/L 104  CO2 22 - 32 mmol/L 22  Calcium 8.9 - 10.3 mg/dL 9.6   CBC Latest Ref Rng & Units 08/18/2018 01/04/2018  WBC 4.0 - 10.5 K/uL 7.7 6.3  Hemoglobin 12.0 - 15.0 g/dL 15.0 13.6  Hematocrit 36.0 - 46.0 % 46.7(H) 42.6  Platelets 150 - 400 K/uL 280 253   PRN Meds:. Medications Discontinued During This Encounter  Medication Reason  . HYDROcodone-acetaminophen (NORCO/VICODIN) 5-325  MG tablet Error  . tiZANidine (ZANAFLEX) 4 MG tablet Error  . tiZANidine (ZANAFLEX) 4 MG tablet Error  . aspirin 325 MG EC tablet Error   Current Meds  Medication Sig  . acetaminophen (TYLENOL) 650 MG CR tablet Take 1,300 mg by mouth 2 (two) times daily as needed for pain.   Marland Kitchen aspirin EC 81 MG tablet Take 81 mg by mouth daily.  . Biotin 5 MG CAPS Take 5 mg by mouth every evening.  . carvedilol (COREG) 6.25 MG tablet Take 6.25 mg by mouth 2 (two) times daily with a meal.  . Cholecalciferol (VITAMIN D-3) 5000 units TABS Take 5,000 tablets by mouth every evening.   . cyclobenzaprine (FLEXERIL) 10 MG tablet Take 10 mg by mouth 3 (three) times daily as needed for muscle spasms.  . Ferrous Sulfate Dried (SLOW RELEASE IRON) 45 MG TBCR Take 45 tablets by mouth 2 (two) times daily.   . furosemide (LASIX) 40 MG tablet Take 40 mg by mouth daily.   Marland Kitchen KLOR-CON M20 20 MEQ tablet Take 40 mEq by mouth daily.  Marland Kitchen losartan (COZAAR) 100 MG tablet Take 100 mg by mouth every evening.   Marland Kitchen omeprazole (PRILOSEC) 40 MG capsule Take 40 mg by mouth daily.  . rosuvastatin (CRESTOR) 5 MG tablet Take 5 mg by mouth  every evening.  . senna (SENOKOT) 8.6 MG tablet Take 3 tablets by mouth daily as needed.   . tacrolimus (PROGRAF) 0.5 MG capsule Take 1 mg by mouth 2 (two) times daily.   Marland Kitchen tretinoin (RETIN-A) 0.025 % cream Apply 1 application topically daily as needed (blemishes).     Cardiac Studies:   Treadmill stress test [12/14/2016]:  No evidence of ischemia. Hypertensive response to exercise.  Patient exercised for 4 minutes, 5.8 METS, peak blood pressure was 215/94 mmHg.  Achieved 94% of MPHR.  Mildly impaired exercise tolerance. No EKG changes. Low risk study.  Echocardiogram 12/16/2017: Left ventricle cavity is normal in size. Mild concentric hypertrophy of the left ventricle. Normal global wall motion. Doppler evidence of grade I (impaired) diastolic dysfunction, normal LAP. Calculated EF 51%. Mild tricuspid regurgitation. Estimated pulmonary artery systolic pressure 25 mmHg.  Assessment   Essential hypertension  Mixed hyperlipidemia  Diet-controlled diabetes mellitus ([place])  FSGS (focal segmental glomerulosclerosis)  EKG 08/28/2018: Sinus rhythm 82 bpm. Normal axis. Normal conduction. Normal EKG.  Recommendations:   Patient presenting for 98-month office visit and follow-up of hypertension and hyperlipidemia, patient with severe marked mixed hyperlipidemia probably related to FSGS.  States that labs have returned to close to normal.  I do not have a recent labs.  She plans to perform this sometime in June again and she will bring me the copies.  She has not had any chest pain, dyspnea is stable, blood pressure is very well controlled on the medications and tolerating carvedilol very well with improvement in both hypertension and also palpitations and heart rate.  Encouraged her to continue to be physically active and to maintain appropriate weight.  She has gained about 3 to 4 pounds in weight over the past few months.  She would like to see back for management of her hypertension and  hyperlipidemia, I will set her up to see me in 3 months and she will bring me the labs with her.  She has had a routine treadmill stress test in which she was hypertensive but was nonischemic in 2018 echocardiogram in 2019 had revealed mild LVH and mild diastolic dysfunction.  I will also  carbon copy this note to her nephrologist Dr. Allyson Sabal.  Adrian Prows, MD, [facility] 03/08/2019, 12:42 PM [facility]. [place] Pager: [phone] Office: [phone] If no answer Cell [phone]

## 2019-06-11 ENCOUNTER — Ambulatory Visit: Admitting: Cardiology

## 2019-06-20 ENCOUNTER — Encounter: Payer: Self-pay | Admitting: Cardiology

## 2019-06-20 DIAGNOSIS — E782 Mixed hyperlipidemia: Secondary | ICD-10-CM | POA: Insufficient documentation

## 2019-06-20 DIAGNOSIS — I10 Essential (primary) hypertension: Secondary | ICD-10-CM

## 2019-06-20 DIAGNOSIS — E785 Hyperlipidemia, unspecified: Secondary | ICD-10-CM

## 2019-06-20 HISTORY — DX: Essential (primary) hypertension: I10

## 2019-06-20 HISTORY — DX: Hyperlipidemia, unspecified: E78.5

## 2019-06-21 ENCOUNTER — Telehealth: Admitting: Cardiology

## 2019-06-21 NOTE — Progress Notes (Deleted)
Primary Physician/Referring:  Camille Bal, PA-C  Patient ID: Mallory Lewis, female    DOB: 1956-02-18, 63 y.o.   MRN: [id number]  No chief complaint on file.  HPI: Imagine Mott  is a 63 y.o. female  with type II diabetes mellitus, GERD, biopsy-proven nephrotic syndrome (FSGS NOS), on tacrolimus therapy. She has severe mixed hyperlipidemia related to nephrotic syndrome, now presents for follow-up of same.  She has been doing well from nephrotic syndrome standpoint and has had improvement in her proteinuria. She denies any chest pain, orthopnea, PND, pre-syncope, syncope, TIA. She has occasional shortness of breath, leg edema. She underwent right hip replacement surgery on 03/09/2019.  In April 2020, had acute exacerbation of FSGS, had to use lasix for 4-5 days and fortunately no proteinuria. Tacrolimus increased to 1 mg twice daily.    Past Medical History:  Diagnosis Date  . Anemia   . Arthritis   . Chronic kidney disease   . Diabetes mellitus without complication ([place])   . GERD (gastroesophageal reflux disease)   . Heart murmur   . HLD (hyperlipidemia) 06/20/2019  . HTN (hypertension) 06/20/2019  . Hypertension     Past Surgical History:  Procedure Laterality Date  . ABDOMINAL HYSTERECTOMY    . APPENDECTOMY    . mohs procedure    . PELVIC FLOOR REPAIR    . TOTAL HIP ARTHROPLASTY Right 08/29/2018   Procedure: TOTAL HIP ARTHROPLASTY ANTERIOR APPROACH;  Surgeon: Melrose Nakayama, MD;  Location: [place];  Service: Orthopedics;  Laterality: Right;    Social History   Socioeconomic History  . Marital status: Married    Spouse name: Not on file  . Number of children: 2  . Years of education: Not on file  . Highest education level: Not on file  Occupational History  . Not on file  Social Needs  . Financial resource strain: Not on file  . Food insecurity    Worry: Not on file    Inability: Not on file  . Transportation needs    Medical: Not on file   Non-medical: Not on file  Tobacco Use  . Smoking status: Former Smoker    Packs/day: 0.25    Types: Cigarettes    Quit date: 01/04/2014    Years since quitting: 5.4  . Smokeless tobacco: Never Used  Substance and Sexual Activity  . Alcohol use: Yes    Frequency: Never    Comment: occ  . Drug use: Never  . Sexual activity: Not on file  Lifestyle  . Physical activity    Days per week: Not on file    Minutes per session: Not on file  . Stress: Not on file  Relationships  . Social Herbalist on phone: Not on file    Gets together: Not on file    Attends religious service: Not on file    Active member of club or organization: Not on file    Attends meetings of clubs or organizations: Not on file    Relationship status: Not on file  . Intimate partner violence    Fear of current or ex partner: Not on file    Emotionally abused: Not on file    Physically abused: Not on file    Forced sexual activity: Not on file  Other Topics Concern  . Not on file  Social History Narrative  . Not on file   Review of Systems  Constitution: Negative for chills, decreased appetite, malaise/fatigue and weight gain.  Cardiovascular: Positive for leg swelling (stable). Negative for chest pain, dyspnea on exertion and syncope.  Endocrine: Negative for cold intolerance.  Hematologic/Lymphatic: Does not bruise/bleed easily.  Musculoskeletal: Positive for joint pain (left hip DJD, S/P right hip arthroplasty). Negative for joint swelling.  Gastrointestinal: Negative for abdominal pain, anorexia, change in bowel habit, hematochezia and melena.  Neurological: Negative for headaches and light-headedness.  Psychiatric/Behavioral: Negative for depression and substance abuse.  All other systems reviewed and are negative.     Objective  There were no vitals taken for this visit. There is no height or weight on file to calculate BMI.    Physical Exam  Constitutional: She appears well-developed  and well-nourished. No distress.  HENT:  Head: Atraumatic.  Eyes: Conjunctivae are normal.  Neck: Neck supple. No JVD present. No thyromegaly present.  Cardiovascular: Normal rate, regular rhythm, S1 normal, S2 normal and intact distal pulses. Exam reveals no gallop.  Murmur heard. Pulmonary/Chest: Effort normal and breath sounds normal.  Abdominal: Soft. Bowel sounds are normal.  Musculoskeletal: Normal range of motion.        General: No edema.  Neurological: She is alert.  Skin: Skin is warm and dry.  Psychiatric: She has a normal mood and affect.   Radiology: No results found.  Laboratory examination:   Labs 12/05/2017: Total cholesterol 333, triglycerides 255, HDL 48, LDL 234. Non-HDL cholesterol 285.  CMP Latest Ref Rng & Units 08/18/2018  Glucose 70 - 99 mg/dL 98  BUN 8 - 23 mg/dL 11  Creatinine 0.44 - 1.00 mg/dL 0.72  Sodium 135 - 145 mmol/L 139  Potassium 3.5 - 5.1 mmol/L 3.4(L)  Chloride 98 - 111 mmol/L 104  CO2 22 - 32 mmol/L 22  Calcium 8.9 - 10.3 mg/dL 9.6   CBC Latest Ref Rng & Units 08/18/2018 01/04/2018  WBC 4.0 - 10.5 K/uL 7.7 6.3  Hemoglobin 12.0 - 15.0 g/dL 15.0 13.6  Hematocrit 36.0 - 46.0 % 46.7(H) 42.6  Platelets 150 - 400 K/uL 280 253   PRN Meds:. There are no discontinued medications. No outpatient medications have been marked as taking for the 06/21/19 encounter (Appointment) with Adrian Prows, MD.    Cardiac Studies:   Treadmill stress test [12/14/2016]:  No evidence of ischemia. Hypertensive response to exercise.  Patient exercised for 4 minutes, 5.8 METS, peak blood pressure was 215/94 mmHg.  Achieved 94% of MPHR.  Mildly impaired exercise tolerance. No EKG changes. Low risk study.  Echocardiogram 12/16/2017: Left ventricle cavity is normal in size. Mild concentric hypertrophy of the left ventricle. Normal global wall motion. Doppler evidence of grade I (impaired) diastolic dysfunction, normal LAP. Calculated EF 51%. Mild tricuspid regurgitation.  Estimated pulmonary artery systolic pressure 25 mmHg.  Assessment   Essential hypertension  Pure hypercholesterolemia  EKG 08/28/2018: Sinus rhythm 82 bpm. Normal axis. Normal conduction. Normal EKG.  Recommendations:   Patient presenting for 26-month office visit and follow-up of hypertension and hyperlipidemia, patient with severe marked mixed hyperlipidemia probably related to FSGS.  States that labs have returned to close to normal.  I do not have a recent labs.  She plans to perform this sometime in June again and she will bring me the copies.  She has not had any chest pain, dyspnea is stable, blood pressure is very well controlled on the medications and tolerating carvedilol very well with improvement in both hypertension and also palpitations and heart rate.  Encouraged her to continue to be physically active and to maintain appropriate weight.  She  has gained about 3 to 4 pounds in weight over the past few months.  She would like to see back for management of her hypertension and hyperlipidemia, I will set her up to see me in 3 months and she will bring me the labs with her.  She has had a routine treadmill stress test in which she was hypertensive but was nonischemic in 2018 echocardiogram in 2019 had revealed mild LVH and mild diastolic dysfunction.  I will also carbon copy this note to her nephrologist Dr. Allyson Sabal.  Adrian Prows, MD, [facility] 06/21/2019, 5:52 AM [facility]. [place] Pager: [phone] Office: [phone] If no answer Cell [phone]

## 2019-06-22 ENCOUNTER — Telehealth (INDEPENDENT_AMBULATORY_CARE_PROVIDER_SITE_OTHER): Admitting: Cardiology

## 2019-06-22 ENCOUNTER — Other Ambulatory Visit: Payer: Self-pay

## 2019-06-22 ENCOUNTER — Encounter: Payer: Self-pay | Admitting: Cardiology

## 2019-06-22 VITALS — BP 122/76 | HR 85 | Ht 66.0 in | Wt 177.0 lb

## 2019-06-22 DIAGNOSIS — Z8249 Family history of ischemic heart disease and other diseases of the circulatory system: Secondary | ICD-10-CM | POA: Diagnosis not present

## 2019-06-22 DIAGNOSIS — I10 Essential (primary) hypertension: Secondary | ICD-10-CM

## 2019-06-22 DIAGNOSIS — E782 Mixed hyperlipidemia: Secondary | ICD-10-CM

## 2019-06-22 NOTE — Progress Notes (Signed)
Virtual Visit via Video Note: This visit type was conducted due to national recommendations for restrictions regarding the COVID-19 Pandemic (e.g. social distancing).  This format is felt to be most appropriate for this patient at this time.  All issues noted in this document were discussed and addressed.  No physical exam was performed (except for noted visual exam findings with Telehealth visits).  The patient has consented to conduct a Telehealth visit and understands insurance will be billed.   I connected with@, on 06/23/19 at  by a video enabled telemedicine application and verified that I am speaking with the correct person using two identifiers.   I discussed the limitations of evaluation and management by telemedicine and the availability of in person appointments. The patient expressed understanding and agreed to proceed.   I have discussed with patient regarding the safety during COVID Pandemic and steps and precautions to be taken including social distancing, frequent hand wash and use of detergent soap, gels with the patient. I asked the patient to avoid touching mouth, nose, eyes, ears with the hands. I encouraged regular walking around the neighborhood and exercise and regular diet, as long as social distancing can be maintained.  Primary Physician/Referring:  Camille Bal, PA-C  Patient ID: Mallory Lewis, female    DOB: [birth year], 63 y.o.   MRN: [id number]  Chief Complaint  Patient presents with  . Hypertension  . Hyperlipidemia  . Follow-up    3 month   HPI: Mallory Lewis  is a 63 y.o. female  with type II diabetes mellitus, GERD, biopsy-proven nephrotic syndrome (FSGS NOS), on tacrolimus therapy. She has severe mixed hyperlipidemia related to nephrotic syndrome, now presents for follow-up of same.  She has been doing well from nephrotic syndrome standpoint and has had improvement in her proteinuria. She denies any chest pain, orthopnea, PND, pre-syncope,  syncope, TIA. She has occasional shortness of breath, leg edema. She underwent right hip replacement surgery on 03/09/2019.  In April 2020, had acute exacerbation of FSGS, had to use lasix for 4-5 days and fortunately no proteinuria. Tacrolimus increased to 1 mg twice daily.   Over the last month since her last OV in May 2020, she is taking extra lasix due to fluid gain. Otherwise remains asymptomatic.   Past Medical History:  Diagnosis Date  . Anemia   . Arthritis   . Chronic kidney disease   . Diabetes mellitus without complication ([place])   . GERD (gastroesophageal reflux disease)   . Heart murmur   . HLD (hyperlipidemia) 06/20/2019  . HTN (hypertension) 06/20/2019  . Hypertension     Past Surgical History:  Procedure Laterality Date  . ABDOMINAL HYSTERECTOMY    . APPENDECTOMY    . mohs procedure    . PELVIC FLOOR REPAIR    . TOTAL HIP ARTHROPLASTY Right 08/29/2018   Procedure: TOTAL HIP ARTHROPLASTY ANTERIOR APPROACH;  Surgeon: Melrose Nakayama, MD;  Location: [place];  Service: Orthopedics;  Laterality: Right;    Social History   Socioeconomic History  . Marital status: Married    Spouse name: Not on file  . Number of children: 2  . Years of education: Not on file  . Highest education level: Not on file  Occupational History  . Not on file  Social Needs  . Financial resource strain: Not on file  . Food insecurity    Worry: Not on file    Inability: Not on file  . Transportation needs    Medical: Not on file  Non-medical: Not on file  Tobacco Use  . Smoking status: Former Smoker    Packs/day: 0.25    Years: 20.00    Pack years: 5.00    Types: Cigarettes    Start date: 34    Quit date: 01/04/2014    Years since quitting: 5.4  . Smokeless tobacco: Never Used  Substance and Sexual Activity  . Alcohol use: Yes    Frequency: Never    Comment: occ  . Drug use: Never  . Sexual activity: Not on file  Lifestyle  . Physical activity    Days per week: Not on file     Minutes per session: Not on file  . Stress: Not on file  Relationships  . Social Herbalist on phone: Not on file    Gets together: Not on file    Attends religious service: Not on file    Active member of club or organization: Not on file    Attends meetings of clubs or organizations: Not on file    Relationship status: Not on file  . Intimate partner violence    Fear of current or ex partner: Not on file    Emotionally abused: Not on file    Physically abused: Not on file    Forced sexual activity: Not on file  Other Topics Concern  . Not on file  Social History Narrative  . Not on file   Review of Systems  Constitution: Negative for chills, decreased appetite, malaise/fatigue and weight gain.  Cardiovascular: Positive for leg swelling (stable). Negative for chest pain, dyspnea on exertion and syncope.  Endocrine: Negative for cold intolerance.  Hematologic/Lymphatic: Does not bruise/bleed easily.  Musculoskeletal: Positive for joint pain (left hip DJD, S/P right hip arthroplasty). Negative for joint swelling.  Gastrointestinal: Negative for abdominal pain, anorexia, change in bowel habit, hematochezia and melena.  Neurological: Negative for headaches and light-headedness.  Psychiatric/Behavioral: Negative for depression and substance abuse.  All other systems reviewed and are negative.  Objective  Blood pressure 122/76, pulse 85, height _0  (1.676 m), weight 177 lb (80.3 kg). Body mass index is 28.57 kg/m.   Physical exam not performed or limited due to virtual visit.  Patient appeared to be in no distress, Neck was supple, respiration was not labored.  Please see exam details from prior visit is as below.  Physical Exam  Constitutional: She appears well-developed and well-nourished. No distress.  HENT:  Head: Atraumatic.  Eyes: Conjunctivae are normal.  Neck: Neck supple. No JVD present. No thyromegaly present.  Cardiovascular: Normal rate, regular rhythm,  S1 normal, S2 normal and intact distal pulses. Exam reveals no gallop.  Murmur heard. Pulmonary/Chest: Effort normal and breath sounds normal.  Abdominal: Soft. Bowel sounds are normal.  Musculoskeletal: Normal range of motion.        General: No edema.  Neurological: She is alert.  Skin: Skin is warm and dry.  Psychiatric: She has a normal mood and affect.   Radiology: No results found.  Laboratory examination:   Labs 04/04/2019: Serum glucose 140 mg, BUN 10, creatinine 0.77, eGFR 82 mL.  Potassium 5.2.  CMP otherwise normal.   A1c 5.8%.  CBC normal.  Serum ferritin mildly elevated at 181.  Total cholesterol 168, triglycerides 147, HDL 46, LDL 93.  TSH normal.  Labs 12/05/2017: Total cholesterol 333, triglycerides 255, HDL 48, LDL 234. Non-HDL cholesterol 285.  CMP Latest Ref Rng & Units 08/18/2018  Glucose 70 - 99 mg/dL 98  BUN 8 - 23 mg/dL 11  Creatinine 0.44 - 1.00 mg/dL 0.72  Sodium 135 - 145 mmol/L 139  Potassium 3.5 - 5.1 mmol/L 3.4(L)  Chloride 98 - 111 mmol/L 104  CO2 22 - 32 mmol/L 22  Calcium 8.9 - 10.3 mg/dL 9.6   CBC Latest Ref Rng & Units 08/18/2018 01/04/2018  WBC 4.0 - 10.5 K/uL 7.7 6.3  Hemoglobin 12.0 - 15.0 g/dL 15.0 13.6  Hematocrit 36.0 - 46.0 % 46.7(H) 42.6  Platelets 150 - 400 K/uL 280 253   PRN Meds:. There are no discontinued medications. Current Meds  Medication Sig  . acetaminophen (TYLENOL) 650 MG CR tablet Take 1,300 mg by mouth 2 (two) times daily as needed for pain.   Marland Kitchen aspirin EC 81 MG tablet Take 81 mg by mouth daily.  . Biotin 5 MG CAPS Take 5 mg by mouth every evening.  . carvedilol (COREG) 6.25 MG tablet Take 6.25 mg by mouth 2 (two) times daily with a meal.  . Cholecalciferol (VITAMIN D-3) 5000 units TABS Take 5,000 tablets by mouth every evening.   . cyclobenzaprine (FLEXERIL) 10 MG tablet Take 10 mg by mouth 3 (three) times daily as needed for muscle spasms.  . Ferrous Sulfate Dried (SLOW RELEASE IRON) 45 MG TBCR Take 45 tablets by  mouth 2 (two) times daily.   . furosemide (LASIX) 40 MG tablet Take 40 mg by mouth daily.   Marland Kitchen KLOR-CON M20 20 MEQ tablet Take 40 mEq by mouth daily.  Marland Kitchen losartan (COZAAR) 100 MG tablet Take 100 mg by mouth every evening.   Marland Kitchen omeprazole (PRILOSEC) 40 MG capsule Take 40 mg by mouth daily.  . rosuvastatin (CRESTOR) 5 MG tablet Take 5 mg by mouth every evening.  . senna (SENOKOT) 8.6 MG tablet Take 3 tablets by mouth daily as needed.   . tacrolimus (PROGRAF) 0.5 MG capsule Take 1 mg by mouth 2 (two) times daily.   Marland Kitchen tretinoin (RETIN-A) 0.025 % cream Apply 1 application topically daily as needed (blemishes).     Cardiac Studies:   Treadmill stress test [12/14/2016]:  No evidence of ischemia. Hypertensive response to exercise.  Patient exercised for 4 minutes, 5.8 METS, peak blood pressure was 215/94 mmHg.  Achieved 94% of MPHR.  Mildly impaired exercise tolerance. No EKG changes. Low risk study.  Echocardiogram 12/16/2017: Left ventricle cavity is normal in size. Mild concentric hypertrophy of the left ventricle. Normal global wall motion. Doppler evidence of grade I (impaired) diastolic dysfunction, normal LAP. Calculated EF 51%. Mild tricuspid regurgitation. Estimated pulmonary artery systolic pressure 25 mmHg.  Assessment   Essential hypertension  Mixed hyperlipidemia  Family history of premature CAD - Brother at age 35 had MI  EKG 08/28/2018: Sinus rhythm 82 bpm. Normal axis. Normal conduction. Normal EKG.  Recommendations:   Patient presenting for 50-monthoffice visit and follow-up of hypertension and hyperlipidemia, patient with severe marked mixed hyperlipidemia probably related to FSGS. She is presently asymptomatic, has been taking furosemide extra dose for fluid gain due to FSGS.  Lipids are completely normalized or nearly normalized with very minimal dose of Crestor which I have recommend continuing the same dose especially in view of strong family history of premature coronary  artery disease. I have reviewed all lab results which are normal or stable.    Her blood pressure is now well controlled.  I would like to see her back in 6 months for close monitoring as she prefers to see me also on a annual basis.  This is a virtual visit.  I spent 15 minutes of my time on direct face-to-face interaction and answering all her questions regarding hypertension, hyperlipidemia and also addressing her risk of premature coronary artery disease in family.  I will also carbon copy this note to her nephrologist Dr. Allyson Sabal.  Adrian Prows, MD, [facility] 06/23/2019, 10:19 AM [facility] [place] Pager: [phone] Office: [phone] If no answer Cell [phone]

## 2019-12-28 ENCOUNTER — Encounter: Payer: Self-pay | Admitting: Cardiology

## 2019-12-28 ENCOUNTER — Other Ambulatory Visit: Payer: Self-pay

## 2019-12-28 ENCOUNTER — Ambulatory Visit: Payer: Medicare Other | Admitting: Cardiology

## 2019-12-28 VITALS — BP 133/87 | HR 76 | Temp 97.2°F | Ht 66.0 in | Wt 188.9 lb

## 2019-12-28 DIAGNOSIS — Z8249 Family history of ischemic heart disease and other diseases of the circulatory system: Secondary | ICD-10-CM

## 2019-12-28 DIAGNOSIS — N051 Unspecified nephritic syndrome with focal and segmental glomerular lesions: Secondary | ICD-10-CM

## 2019-12-28 DIAGNOSIS — E119 Type 2 diabetes mellitus without complications: Secondary | ICD-10-CM

## 2019-12-28 DIAGNOSIS — E7801 Familial hypercholesterolemia: Secondary | ICD-10-CM

## 2019-12-28 DIAGNOSIS — I10 Essential (primary) hypertension: Secondary | ICD-10-CM

## 2019-12-28 MED ORDER — SPIRONOLACTONE 25 MG PO TABS: 25.0000 mg | ORAL_TABLET | Freq: Every morning | ORAL | 2 refills | Status: DC

## 2019-12-28 NOTE — Progress Notes (Addendum)
Primary Physician/Referring:  Aurea Graff, PA-C (Inactive)  Patient ID: Mallory Lewis, female    DOB: Dec 16, 1955, 64 y.o.   MRN: [id number]  Chief Complaint  Patient presents with  . Hypertension  . Hyperlipidemia  . Follow-up    6 month   HPI: Mallory Lewis  is a 64 y.o. female  with type II diabetes mellitus, GERD, biopsy-proven nephrotic syndrome (FSGS NOS), on tacrolimus therapy, severe mixed hyperlipidemia related to nephrotic syndrome presents for routine visit to f/u her CV risk factors.   She has been doing well from nephrotic syndrome standpoint and has had improvement in her proteinuria. She denies any chest pain, orthopnea, PND, pre-syncope, syncope, TIA. She has occasional shortness of breath, leg edema. Her latest surgery was right hip replacement surgery on 03/09/2019.   Past Medical History:  Diagnosis Date  . Anemia   . Arthritis   . Chronic kidney disease   . Diabetes mellitus without complication ([place])   . GERD (gastroesophageal reflux disease)   . Heart murmur   . HLD (hyperlipidemia) 06/20/2019  . HTN (hypertension) 06/20/2019  . Hypertension     Past Surgical History:  Procedure Laterality Date  . ABDOMINAL HYSTERECTOMY    . APPENDECTOMY    . mohs procedure    . PELVIC FLOOR REPAIR    . TOTAL HIP ARTHROPLASTY Right 08/29/2018   Procedure: TOTAL HIP ARTHROPLASTY ANTERIOR APPROACH;  Surgeon: Melrose Nakayama, MD;  Location: [place];  Service: Orthopedics;  Laterality: Right;   Social History   Tobacco Use  . Smoking status: Former Smoker    Packs/day: 0.25    Years: 20.00    Pack years: 5.00    Types: Cigarettes    Start date: 54    Quit date: 01/04/2014    Years since quitting: 6.1  . Smokeless tobacco: Never Used  Substance Use Topics  . Alcohol use: Yes    Comment: occ   Family History  Problem Relation Age of Onset  . Hypothyroidism Mother   . Atrial fibrillation Mother   . Diabetes Mother   . Dementia Father   . CAD  Father   . Diabetes Father    Review of Systems  Cardiovascular: Positive for leg swelling. Negative for chest pain and dyspnea on exertion.  Musculoskeletal: Positive for joint pain.  Gastrointestinal: Negative for melena.   Objective  Blood pressure 133/87, pulse 76, temperature (!) 97.2 F (36.2 C), height '5\' 6"'  (1.676 m), weight 188 lb 14.4 oz (85.7 kg), SpO2 98 %. Body mass index is 30.49 kg/m. Vitals with BMI 12/28/2019 06/22/2019 03/08/2019  Height '5\' 6"'  '5\' 6"'  '5\' 6"'   Weight 188 lbs 14 oz 177 lbs 175 lbs  BMI 30.5 10.25 85.27  Systolic 782 423 536  Diastolic 87 76 73  Pulse 76 85 -    Physical Exam  Cardiovascular: Normal rate, regular rhythm, normal heart sounds and intact distal pulses. Exam reveals no gallop.  No murmur heard. No leg edema, no JVD.  Pulmonary/Chest: Effort normal and breath sounds normal.  Abdominal: Soft. Bowel sounds are normal.   Radiology: CT CARDIAC SCORING  Result Date: 02/11/2020 CLINICAL DATA:  64 year old female hyperlipidemia.  Caucasian. EXAM: CT CARDIAC CORONARY ARTERY CALCIUM SCORE TECHNIQUE: Non-contrast imaging through the heart was performed using prospective ECG gating. Image post processing was performed on an independent workstation, allowing for quantitative analysis of the heart and coronary arteries. Note that this exam targets the heart and the chest was not imaged in its entirety.  COMPARISON:  None. FINDINGS: Technical quality: Good CORONARY CALCIUM SCORES: Left Main: No coronary artery calcification LAD: 252 LCx: 45.8 RCA: 125 CORONARY CALCIUM Total Agatston Score: 423 MESA database percentile: 3 Ascending aorta (normal <  40 mm): 33 mm EXTRACARDIAC FINDINGS: Limited view of the lung parenchyma demonstrates 2 mm subpleural nodule in the RIGHT lower lobe (image 37/series 3). Airways are normal. Limited view of the mediastinum demonstrates no adenopathy. Esophagus normal. Limited view of the upper abdomen unremarkable. Limited view of the  skeleton and chest wall is unremarkable. IMPRESSION: 1. Three-vessel coronary artery calcification. 2. Total Agatston Score: 423 3. MESA age and sex matched database percentile: 95 4. Small RIGHT lobe pulmonary nodule. No follow-up needed if patient is low-risk. Non-contrast chest CT can be considered in 12 months if patient is high-risk. This recommendation follows the consensus statement: Guidelines for Management of Incidental Pulmonary Nodules Detected on CT Images: From the Fleischner Society 2017; Radiology 2017; 284:228-243. Electronically Signed   By: Suzy Bouchard M.D.   On: 02/11/2020 12:44    Laboratory examination:   External labs:  Labs 11/01/2019: A1c 6.3%. Serum glucose 102 g, BUN 13, creatinine 0.92.  CMP otherwise normal. Hb 14.2/HCT 41.9, platelets 271.  Labs 04/04/2019:  Serum glucose 140 mg, BUN 10, creatinine 0.77, eGFR 82 mL.  Potassium 5.2.  CMP otherwise normal.   A1c 5.8%.  CBC normal.  Serum ferritin mildly elevated at 181.  Total cholesterol 168, triglycerides 147, HDL 46, LDL 93.  TSH normal.  Labs 12/05/2017:  Total cholesterol 333, triglycerides 255, HDL 48, LDL 234. Non-HDL cholesterol 285.  PRN Meds:. Medications Discontinued During This Encounter  Medication Reason  . furosemide (LASIX) 40 MG tablet Discontinued by provider  . tacrolimus (PROGRAF) 0.5 MG capsule Dose change  . KLOR-CON M20 20 MEQ tablet Discontinued by provider   Current Meds  Medication Sig  . acetaminophen (TYLENOL) 650 MG CR tablet Take 1,300 mg by mouth 2 (two) times daily as needed for pain.   Marland Kitchen aspirin EC 81 MG tablet Take 81 mg by mouth daily.  . Biotin 5 MG CAPS Take 5 mg by mouth every evening.  . carvedilol (COREG) 6.25 MG tablet Take 6.25 mg by mouth 2 (two) times daily with a meal.  . Cholecalciferol (VITAMIN D-3) 5000 units TABS Take 5,000 tablets by mouth every evening.   . cyclobenzaprine (FLEXERIL) 10 MG tablet Take 10 mg by mouth 3 (three) times daily as needed for  muscle spasms.  . Ferrous Sulfate Dried (SLOW RELEASE IRON) 45 MG TBCR Take 45 tablets by mouth 2 (two) times daily.   Marland Kitchen losartan (COZAAR) 100 MG tablet Take 100 mg by mouth every evening.   Marland Kitchen omeprazole (PRILOSEC) 40 MG capsule Take 40 mg by mouth daily.  . rosuvastatin (CRESTOR) 5 MG tablet Take 5 mg by mouth every evening.  . senna (SENOKOT) 8.6 MG tablet Take 3 tablets by mouth daily as needed.   . tacrolimus (PROGRAF) 1 MG capsule Take 1 mg by mouth 2 (two) times daily.  Marland Kitchen torsemide (DEMADEX) 20 MG tablet Take 20 mg by mouth daily. May take extra 17m in the afternoon or evening  . tretinoin (RETIN-A) 0.025 % cream Apply 1 application topically daily as needed (blemishes).   . [DISCONTINUED] KLOR-CON M20 20 MEQ tablet Take 40 mEq by mouth daily.    Cardiac Studies:   Treadmill stress test [12/14/2016]:  No evidence of ischemia. Hypertensive response to exercise.  Patient exercised for 4 minutes, 5.8  METS, peak blood pressure was 215/94 mmHg.  Achieved 94% of MPHR.  Mildly impaired exercise tolerance. No EKG changes. Low risk study.  Echocardiogram 12/16/2017: Left ventricle cavity is normal in size. Mild concentric hypertrophy of the left ventricle. Normal global wall motion. Doppler evidence of grade I (impaired) diastolic dysfunction, normal LAP. Calculated EF 51%. Mild tricuspid regurgitation. Estimated pulmonary artery systolic pressure 25 mmHg.  EKG 12/28/2019: Normal sinus rhythm at rate of 72 bpm, normal axis.  No evidence of ischemia, normal EKG.  Assessment     ICD-10-CM   1. Essential hypertension  I10 EKG 12-Lead    spironolactone (ALDACTONE) 25 MG tablet    Basic metabolic panel  2. Familial hypercholesteremia  E78.01 CT CARDIAC SCORING  3. Diet-controlled diabetes mellitus ([place])  E11.9   4. FSGS (focal segmental glomerulosclerosis)  N05.1   5. Family history of premature CAD (Brother CAD at age 17Y)  Z11.49      Recommendations:   Mallory Lewis  is a 64 y.o.   female  with type II diabetes mellitus, GERD, biopsy-proven nephrotic syndrome (FSGS NOS), on tacrolimus therapy, severe mixed hyperlipidemia related to nephrotic syndrome presents for routine visit to f/u her CV risk factors.  She is presently asymptomatic, has been taking furosemide extra dose for fluid gain due to FSGS.  Lipids are completely normalized or nearly normalized with very minimal dose of Crestor which I have recommend continuing the same dose especially in view of strong family history of premature coronary artery disease.  I suspect she probably has familial hyperlipidemia in view of family history and also markedly elevated LDL of greater than 200.  I will obtain coronary calcium score for stratification.  She is asymptomatic, states that she has noticed her blood pressure to be elevated and like to bring the blood pressure down around 180 mmHg.  I have added Aldactone 25 mg daily, she has been taking torsemide for fluid overload state that she develops frequently if she does not take a diuretic.  Hopefully this will help in reducing the need for torsemide.  I have discontinued potassium supplements.  Will obtain a BMP in 2 weeks, and will decide whether she needs potassium supplements as well.  Advised her that if leg edema does improve and abdominal distention does improve just with Aldactone, she can start weaning herself off of torsemide.  This should help with blood pressure control as well.  She prefers to see me also on a annual basis.   Adrian Prows, MD, [facility] 02/11/2020, 1:01 PM [facility] [place] Pager: [phone] Office: [phone]

## 2020-01-11 LAB — BASIC METABOLIC PANEL
BUN/Creatinine Ratio: 25 (ref 12–28)
BUN: 22 mg/dL (ref 8–27)
CO2: 26 mmol/L (ref 20–29)
Calcium: 9.9 mg/dL (ref 8.7–10.3)
Chloride: 100 mmol/L (ref 96–106)
Creatinine, Ser: 0.89 mg/dL (ref 0.57–1.00)
GFR calc Af Amer: 79 mL/min/{1.73_m2} (ref >59)
GFR calc non Af Amer: 68 mL/min/{1.73_m2} (ref >59)
Glucose: 139 mg/dL — ABNORMAL HIGH (ref 65–99)
Potassium: 4.7 mmol/L (ref 3.5–5.2)
Sodium: 139 mmol/L (ref 134–144)

## 2020-01-14 ENCOUNTER — Other Ambulatory Visit

## 2020-01-24 ENCOUNTER — Other Ambulatory Visit

## 2020-02-11 ENCOUNTER — Ambulatory Visit
Admission: RE | Admit: 2020-02-11 | Discharge: 2020-02-11 | Disposition: A | Discharge status: Home / Self Care | Payer: No Typology Code available for payment source | Source: Ambulatory Visit | Attending: Cardiology | Admitting: Cardiology

## 2020-02-11 DIAGNOSIS — Z8249 Family history of ischemic heart disease and other diseases of the circulatory system: Secondary | ICD-10-CM

## 2020-02-11 DIAGNOSIS — E7879 Other disorders of bile acid and cholesterol metabolism: Secondary | ICD-10-CM

## 2020-02-11 DIAGNOSIS — E7801 Familial hypercholesterolemia: Secondary | ICD-10-CM

## 2020-02-11 DIAGNOSIS — R931 Abnormal findings on diagnostic imaging of heart and coronary circulation: Secondary | ICD-10-CM

## 2020-02-11 NOTE — Progress Notes (Signed)
Marked elevation in coronary calcium.  Her lipids prior to Crestor and on Crestor 5 mg: Labs 04/04/2019:  Total cholesterol 168, triglycerides 147, HDL 46, LDL 93.  TSH normal.  Labs 12/05/2017:  Total cholesterol 333, triglycerides 255, HDL 48, LDL 234. Non-HDL cholesterol 285.  Would recommend increasing Crestor to 10 mg and get LDL <70. Patient messaged on My Chart

## 2020-02-12 ENCOUNTER — Other Ambulatory Visit: Payer: Self-pay

## 2020-02-12 MED ORDER — ROSUVASTATIN CALCIUM 10 MG PO TABS: 10.0000 mg | ORAL_TABLET | Freq: Every day | ORAL | 1 refills | Status: AC

## 2020-02-12 NOTE — Telephone Encounter (Signed)
Set her up to see [place] back in 3 months.  I will put orders for labs to be done in 3 months prior to OV

## 2020-03-20 ENCOUNTER — Other Ambulatory Visit: Payer: Self-pay | Admitting: Cardiology

## 2020-03-20 DIAGNOSIS — I10 Essential (primary) hypertension: Secondary | ICD-10-CM

## 2020-04-11 ENCOUNTER — Ambulatory Visit (AMBULATORY_SURGERY_CENTER): Payer: Self-pay | Admitting: *Deleted

## 2020-04-11 ENCOUNTER — Other Ambulatory Visit: Payer: Self-pay

## 2020-04-11 VITALS — Ht 66.0 in | Wt 185.6 lb

## 2020-04-11 DIAGNOSIS — Z86010 Personal history of colonic polyps: Secondary | ICD-10-CM

## 2020-04-11 DIAGNOSIS — Z1211 Encounter for screening for malignant neoplasm of colon: Secondary | ICD-10-CM

## 2020-04-11 MED ORDER — SUPREP BOWEL PREP KIT 17.5-3.13-1.6 GM/177ML PO SOLN: 1.0000 | Freq: Once | ORAL | 0 refills | Status: AC

## 2020-04-11 NOTE — Progress Notes (Signed)
11-23-19 cov vacc x 2   No egg or soy allergy known to patient  No issues with past sedation with any surgeries  or procedures, no intubation problems  No diet pills per patient No home 02 use per patient  No blood thinners per patient  Pt states  issues with constipation - Uses Senna- mostly soft regular BM's daily  No A fib or A flutter  EMMI video sent to pt's e mail  COVID 19 guidelines implemented in [place] today   Pt states 10 yrs ago she had a colon and had polyps, but she is not sure what type- she said they were benign nut not sure type - referral is Hx colon polyps-   Due to the COVID-19 pandemic we are asking patients to follow these guidelines. Please only bring one care partner. Please be aware that your care partner may wait in the car in the parking lot or if they feel like they will be too hot to wait in the car, they may wait in the lobby on the 4th floor. All care partners are required to wear a mask the entire time (we do not have any that we can provide them), they need to practice social distancing, and we will do a Covid check for all patient's and care partners when you arrive. Also we will check their temperature and your temperature. If the care partner waits in their car they need to stay in the parking lot the entire time and we will call them on their cell phone when the patient is ready for discharge so they can bring the car to the front of the building. Also all patient's will need to wear a mask into building.

## 2020-05-02 ENCOUNTER — Other Ambulatory Visit: Payer: Self-pay

## 2020-05-02 ENCOUNTER — Encounter: Payer: Self-pay | Admitting: Gastroenterology

## 2020-05-02 ENCOUNTER — Ambulatory Visit (AMBULATORY_SURGERY_CENTER): Payer: Medicare Other | Admitting: Gastroenterology

## 2020-05-02 VITALS — BP 118/45 | HR 65 | Temp 96.8°F | Resp 12 | Ht 66.0 in | Wt 185.0 lb

## 2020-05-02 DIAGNOSIS — K6389 Other specified diseases of intestine: Secondary | ICD-10-CM

## 2020-05-02 DIAGNOSIS — D123 Benign neoplasm of transverse colon: Secondary | ICD-10-CM | POA: Diagnosis not present

## 2020-05-02 DIAGNOSIS — D128 Benign neoplasm of rectum: Secondary | ICD-10-CM

## 2020-05-02 DIAGNOSIS — K621 Rectal polyp: Secondary | ICD-10-CM

## 2020-05-02 DIAGNOSIS — Z86010 Personal history of colonic polyps: Secondary | ICD-10-CM | POA: Diagnosis not present

## 2020-05-02 MED ORDER — SODIUM CHLORIDE 0.9 % IV SOLN: 500.0000 mL | Freq: Once | INTRAVENOUS | Status: DC

## 2020-05-02 NOTE — Progress Notes (Signed)
Called to room to assist during endoscopic procedure.  Patient ID and intended procedure confirmed with present staff. Received instructions for my participation in the procedure from the performing physician.  

## 2020-05-02 NOTE — Op Note (Signed)
[facility] Patient Name: Mallory Lewis Procedure Date: 05/02/2020 10:08 AM MRN: [id number] Endoscopist: Gerrit Heck , MD Age: 64 Referring MD:  Date of Birth: 02-Oct-1956 Gender: Female Account #: [id number] Procedure:                Colonoscopy Indications:              Screening for colorectal malignant neoplasm (last                            colonoscopy was 10 years ago) Medicines:                Monitored Anesthesia Care Procedure:                Pre-Anesthesia Assessment:                           - Prior to the procedure, a History and Physical                            was performed, and patient medications and                            allergies were reviewed. The patient's tolerance of                            previous anesthesia was also reviewed. The risks                            and benefits of the procedure and the sedation                            options and risks were discussed with the patient.                            All questions were answered, and informed consent                            was obtained. Prior Anticoagulants: The patient has                            taken no previous anticoagulant or antiplatelet                            agents. ASA Grade Assessment: III - A patient with                            severe systemic disease. After reviewing the risks                            and benefits, the patient was deemed in                            satisfactory condition to undergo the procedure.  After obtaining informed consent, the colonoscope                            was passed under direct vision. Throughout the                            procedure, the patient's blood pressure, pulse, and                            oxygen saturations were monitored continuously. The                            Colonoscope was introduced through the anus and                            advanced to the the  cecum, identified by                            appendiceal orifice and ileocecal valve. The                            colonoscopy was performed without difficulty. The                            patient tolerated the procedure well. The quality                            of the bowel preparation was good. The ileocecal                            valve, appendiceal orifice, and rectum were                            photographed. Scope In: 10:25:55 AM Scope Out: 10:41:51 AM Scope Withdrawal Time: 0 hours 11 minutes 58 seconds  Total Procedure Duration: 0 hours 15 minutes 56 seconds  Findings:                 The perianal and digital rectal examinations were                            normal.                           Two sessile polyps were found in the transverse                            colon and hepatic flexure. The polyps were 2 to 3                            mm in size. These polyps were removed with a cold                            biopsy forceps. Resection and retrieval were  complete. Estimated blood loss was minimal.                           Three sessile polyps were found in the rectum. The                            polyps were 2 to 3 mm in size. These polyps were                            removed with a cold biopsy forceps. Resection and                            retrieval were complete. Estimated blood loss was                            minimal.                           A diffuse area of melanosis was found throughout                            the colon, most pronounced in the transverse colon,                            in the ascending colon and in the cecum.                           The retroflexed view of the distal rectum and anal                            verge was normal and showed no anal or rectal                            abnormalities. Complications:            No immediate complications. Estimated Blood Loss:     Estimated  blood loss was minimal. Impression:               - Two 2 to 3 mm polyps in the transverse colon and                            at the hepatic flexure, removed with a cold biopsy                            forceps. Resected and retrieved.                           - Three 2 to 3 mm polyps in the rectum, removed                            with a cold biopsy forceps. Resected and retrieved.                           -  Benign melanosis coli noted.                           - The distal rectum and anal verge are normal on                            retroflexion view. Recommendation:           - Patient has a contact number available for                            emergencies. The signs and symptoms of potential                            delayed complications were discussed with the                            patient. Return to normal activities tomorrow.                            Written discharge instructions were provided to the                            patient.                           - Resume previous diet.                           - Continue present medications.                           - Await pathology results.                           - Repeat colonoscopy for surveillance based on                            pathology results.                           - Return to GI office PRN. Gerrit Heck, MD 05/02/2020 10:47:06 AM

## 2020-05-02 NOTE — Patient Instructions (Signed)
Impression/Recommendations:  [facility] handout given to patient.  Resume previous diet. Continue present medications. Await pathology results.  Repeat colonoscopy for surveillance.  Date to be determined based on pathology results.  Return to GI office as needed.  YOU HAD AN ENDOSCOPIC PROCEDURE TODAY AT [facility]:   Refer to the procedure report that was given to you for any specific questions about what was found during the examination.  If the procedure report does not answer your questions, please call your gastroenterologist to clarify.  If you requested that your care partner not be given the details of your procedure findings, then the procedure report has been included in a sealed envelope for you to review at your convenience later.  YOU SHOULD EXPECT: Some feelings of bloating in the abdomen. Passage of more gas than usual.  Walking can help get rid of the air that was put into your GI tract during the procedure and reduce the bloating. If you had a lower endoscopy (such as a colonoscopy or flexible sigmoidoscopy) you may notice spotting of blood in your stool or on the toilet paper. If you underwent a bowel prep for your procedure, you may not have a normal bowel movement for a few days.  Please Note:  You might notice some irritation and congestion in your nose or some drainage.  This is from the oxygen used during your procedure.  There is no need for concern and it should clear up in a day or so.  SYMPTOMS TO REPORT IMMEDIATELY:   Following lower endoscopy (colonoscopy or flexible sigmoidoscopy):  Excessive amounts of blood in the stool  Significant tenderness or worsening of abdominal pains  Swelling of the abdomen that is new, acute  Fever of 100F or higher For urgent or emergent issues, a gastroenterologist can be reached at any hour by calling [phone]. Do not use MyChart messaging for urgent concerns.    DIET:  We do recommend a small meal at  first, but then you may proceed to your regular diet.  Drink plenty of fluids but you should avoid alcoholic beverages for 24 hours.  ACTIVITY:  You should plan to take it easy for the rest of today and you should NOT DRIVE or use heavy machinery until tomorrow (because of the sedation medicines used during the test).    FOLLOW UP: Our staff will call the number listed on your records 48-72 hours following your procedure to check on you and address any questions or concerns that you may have regarding the information given to you following your procedure. If we do not reach you, we will leave a message.  We will attempt to reach you two times.  During this call, we will ask if you have developed any symptoms of COVID 19. If you develop any symptoms (ie: fever, flu-like symptoms, shortness of breath, cough etc.) before then, please call [phone].  If you test positive for Covid 19 in the 2 weeks post procedure, please call and report this information to [place].    If any biopsies were taken you will be contacted by phone or by letter within the next 1-3 weeks.  Please call us at [phone] if you have not heard about the biopsies in 3 weeks.    SIGNATURES/CONFIDENTIALITY: You and/or your care partner have signed paperwork which will be entered into your electronic medical record.  These signatures attest to the fact that that the information above on your After Visit Summary has been reviewed  and is understood.  Full responsibility of the confidentiality of this discharge information lies with you and/or your care-partner. 

## 2020-05-02 NOTE — Progress Notes (Signed)
Pt's states no medical or surgical changes since previsit or office visit. VS by CW. 

## 2020-05-02 NOTE — Progress Notes (Signed)
To PACU, VSS. Report to [email or web address] 

## 2020-05-06 ENCOUNTER — Telehealth: Payer: Self-pay | Admitting: *Deleted

## 2020-05-06 NOTE — Telephone Encounter (Signed)
°  Follow up Call-  Call back number 05/02/2020  Post procedure Call Back phone  # [phone]  Permission to leave phone message Yes  Some recent data might be hidden     Patient questions:  Do you have a fever, pain , or abdominal swelling? No. Pain Score  0 *  Have you tolerated food without any problems? Yes.    Have you been able to return to your normal activities? Yes.    Do you have any questions about your discharge instructions: Diet   No. Medications  No. Follow up visit  No.  Do you have questions or concerns about your Care? No.  Actions: * If pain score is 4 or above: No action needed, pain <4  1. Have you developed a fever since your procedure? NO  2.   Have you had an respiratory symptoms (SOB or cough) since your procedure? NO  3.   Have you tested positive for COVID 19 since your procedure NO  4.   Have you had any family members/close contacts diagnosed with the COVID 19 since your procedure? NO   If yes to any of these questions please route to Joylene John, RN and Erenest Rasher, RN

## 2020-05-08 ENCOUNTER — Encounter: Payer: Medicare Other | Attending: Family Medicine | Admitting: Dietician

## 2020-05-08 ENCOUNTER — Encounter: Payer: Self-pay | Admitting: Dietician

## 2020-05-08 ENCOUNTER — Other Ambulatory Visit: Payer: Self-pay

## 2020-05-08 DIAGNOSIS — E1121 Type 2 diabetes mellitus with diabetic nephropathy: Secondary | ICD-10-CM | POA: Insufficient documentation

## 2020-05-08 NOTE — Progress Notes (Signed)
Diabetes Self-Management Education  Visit Type: First/Initial  Appt. Start Time: 10:55am   Appt. End Time: 11:50am  05/08/2020  Mallory Lewis, identified by name and date of birth, is a 64 y.o. female with a diagnosis of Diabetes: Type 2.   ASSESSMENT  Patient states she has had prediabetes for several years and recently found her A1c at 6.7% in May 2021. States her fasting glucose at that time was 175. After starting medication, patient reports her blood glucose readings were mostly 130s-140s, and more recently have been in the 120s-130s. States that monitoring portion sizes and finding lower sugar options has helped. For example, no sugar added ketchup and salad dressings, and [employer] carb smart ice cream with a custard dish portion vs a bowl size portion. States she monitors her sodium intake.    Diabetes Self-Management Education - 05/08/20 1101      Visit Information   Visit Type First/Initial      Initial Visit   Diabetes Type Type 2    Are you currently following a meal plan? Yes    What type of meal plan do you follow? low carb/ low fat    Are you taking your medications as prescribed? Yes    Date Diagnosed May 2021      Health Coping   How would you rate your overall health? Fair      Psychosocial Assessment   Patient Belief/Attitude about Diabetes Motivated to manage diabetes    Self-care barriers None    Special Needs None    Preferred Learning Style No preference indicated    Learning Readiness Change in progress    How often do you need to have someone help you when you read instructions, pamphlets, or other written materials from your doctor or pharmacy? 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 1-2 times/day    Fasting Blood glucose range (mg/dL) 70-129;130-179    Postprandial Blood glucose range  (mg/dL) 70-129;130-179    Have you had a dilated eye exam in the past 12 months? No    Have you had a dental exam in the past 12 months? Yes    Are you checking your feet? Yes    How many days per week are you checking your feet? 7      Dietary Intake   [place] bar (20g carbs, 15 g protein) + coffee    Lunch 1/2 english muffin + pizza sauce + cheese    Snack (afternoon) chocolate almond milk   or low-fat cottage cheese w/ fruit; or hummus w/ veggies   Dinner chicken parmesan w/ provolone + penne pasta w/ sauce + salad w/ Skinny Girl dressing    Snack (evening) pistachio pudding (fat-free sugar-free)    Beverage(s) water, half sweet/unsweet tea, Crystal Light lemonade      Exercise   Exercise Type ADL's    How many days per week to you exercise? 0    How many minutes per day do you exercise? 0    Total minutes per week of exercise 0      Patient Education   Previous Diabetes Education No    Disease state  Definition of diabetes, type 1 and 2, and the diagnosis of diabetes;Factors that contribute to the development of diabetes;Explored patient's  options for treatment of their diabetes    Nutrition management  Role of diet in the treatment of diabetes and the relationship between the three main macronutrients and blood glucose level;Food label reading, portion sizes and measuring food.;Carbohydrate counting;Reviewed blood glucose goals for pre and post meals and how to evaluate the patients' food intake on their blood glucose level.;Information on hints to eating out and maintain blood glucose control.;Meal options for control of blood glucose level and chronic complications.    Monitoring Identified appropriate SMBG and/or A1C goals.;Purpose and frequency of SMBG.    Acute complications Taught treatment of hypoglycemia - the 15 rule.;Discussed and identified patients' treatment of hyperglycemia.      Individualized Goals (developed by patient)   Nutrition General guidelines  for healthy choices and portions discussed   2-3 carb choices per meal; 0-2 carb choices per snack; protein with all meals and snacks   Monitoring  test my blood glucose as discussed   fasting; 2 hours after dinner     Outcomes   Expected Outcomes Demonstrated interest in learning. Expect positive outcomes    Future DMSE PRN           Individualized Plan for Diabetes Self-Management Training:  Learning Objective:  Patient will have a greater understanding of diabetes self-management. Patient education plan is to attend individual and/or group sessions per assessed needs and concerns.   Plan:   Patient Instructions   Aim for 2-3 carb choices (or 30-45 grams of carbohydrates) per meal.   Aim for 0-2 carb choices (or 0-30 grams of carbohydrates) per snack.   Include a source of protein with meals/snacks.   Prioritize fiber foods, including whole grains, beans, vegetables, brown rice, oats, etc.   Great job with watching portion sizes and making low sugar substitutions!    Expected Outcomes:  Demonstrated interest in learning. Expect positive outcomes  Education material provided: Meal plan card, My Plate and Snack sheet  If problems or questions, patient to contact team via:  Phone and Email  Future DSME appointment: PRN

## 2020-05-08 NOTE — Patient Instructions (Signed)
   Aim for 2-3 carb choices (or 30-45 grams of carbohydrates) per meal.   Aim for 0-2 carb choices (or 0-30 grams of carbohydrates) per snack.   Include a source of protein with meals/snacks.   Prioritize fiber foods, including whole grains, beans, vegetables, brown rice, oats, etc.   Great job with watching portion sizes and making low sugar substitutions!

## 2020-05-14 ENCOUNTER — Ambulatory Visit: Admitting: Cardiology

## 2020-05-15 ENCOUNTER — Encounter: Payer: Self-pay | Admitting: Gastroenterology

## 2020-05-16 LAB — APO A1 + B + RATIO
Apolipo. B/A-1 Ratio: 0.7 ratio — ABNORMAL HIGH (ref 0.0–0.6)
Apolipoprotein A-1: 129 mg/dL (ref 116–209)
Apolipoprotein B: 90 mg/dL — ABNORMAL HIGH (ref <90)

## 2020-05-16 LAB — LIPID PANEL WITH LDL/HDL RATIO
Cholesterol, Total: 146 mg/dL (ref 100–199)
HDL: 35 mg/dL — ABNORMAL LOW (ref >39)
LDL Chol Calc (NIH): 79 mg/dL (ref 0–99)
LDL/HDL Ratio: 2.3 ratio (ref 0.0–3.2)
Triglycerides: 186 mg/dL — ABNORMAL HIGH (ref 0–149)
VLDL Cholesterol Cal: 32 mg/dL (ref 5–40)

## 2020-05-16 LAB — LIPOPROTEIN A (LPA): Lipoprotein (a): 186.8 nmol/L — ABNORMAL HIGH (ref <75.0)

## 2020-05-16 LAB — LDL CHOLESTEROL, DIRECT: LDL Direct: 80 mg/dL (ref 0–99)

## 2020-05-19 ENCOUNTER — Ambulatory Visit: Payer: Medicare Other | Admitting: Cardiology

## 2020-05-19 ENCOUNTER — Other Ambulatory Visit: Payer: Self-pay

## 2020-05-19 ENCOUNTER — Encounter: Payer: Self-pay | Admitting: Cardiology

## 2020-05-19 VITALS — BP 115/75 | HR 84 | Resp 16 | Ht 66.0 in | Wt 184.6 lb

## 2020-05-19 DIAGNOSIS — Z8249 Family history of ischemic heart disease and other diseases of the circulatory system: Secondary | ICD-10-CM

## 2020-05-19 DIAGNOSIS — I10 Essential (primary) hypertension: Secondary | ICD-10-CM

## 2020-05-19 DIAGNOSIS — R931 Abnormal findings on diagnostic imaging of heart and coronary circulation: Secondary | ICD-10-CM

## 2020-05-19 DIAGNOSIS — E7841 Elevated Lipoprotein(a): Secondary | ICD-10-CM

## 2020-05-19 DIAGNOSIS — E7801 Familial hypercholesterolemia: Secondary | ICD-10-CM

## 2020-05-19 MED ORDER — CARVEDILOL 3.125 MG PO TABS: 3.1250 mg | ORAL_TABLET | Freq: Two times a day (BID) | ORAL | Status: AC

## 2020-05-19 NOTE — [place] Notes (Signed)
Primary Physician/Referring:  Aurea Graff, PA-C (Inactive)  Patient ID: Mallory Lewis, female    DOB: [birth year], 64 y.o.   MRN: [id number]  Chief Complaint  Patient presents with  . Hypertension  . Follow-up    3 month   HPI: [name]  is a 64 y.o. female  with type II diabetes mellitus, GERD, biopsy-proven nephrotic syndrome (FSGS NOS), on tacrolimus therapy, severe mixed hyperlipidemia related to nephrotic syndrome presents for routine visit to f/u her CV risk factors.   She has been doing well from nephrotic syndrome. She denies any chest pain, orthopnea, PND, pre-syncope, syncope, TIA. She has occasional shortness of breath, leg edema. Her latest surgery was right hip replacement surgery on 03/09/2019 without peri-procedural cardiac complications.    Past Medical History:  Diagnosis Date  . Allergy   . Anemia   . Arthritis   . Blood transfusion without reported diagnosis   . Cancer ([place])    skin cancer with MOHS   . Chronic kidney disease   . Constipation    uses Senna daily   . Diabetes mellitus without complication ([place])   . GERD (gastroesophageal reflux disease)   . Heart murmur    acute nephrotic syndrome   . HLD (hyperlipidemia) 06/20/2019  . HTN (hypertension) 06/20/2019  . Hypertension   . Osteopenia     Past Surgical History:  Procedure Laterality Date  . ABDOMINAL HYSTERECTOMY    . APPENDECTOMY    . COLONOSCOPY    . mohs procedure    . PELVIC FLOOR REPAIR    . POLYPECTOMY    . TOTAL HIP ARTHROPLASTY Right 08/29/2018   Procedure: TOTAL HIP ARTHROPLASTY ANTERIOR APPROACH;  Surgeon: Melrose Nakayama, MD;  Location: [facility];  Service: Orthopedics;  Laterality: Right;   Social History   Tobacco Use  . Smoking status: Former Smoker    Packs/day: 0.25    Years: 20.00    Pack years: 5.00    Types: Cigarettes    Start date: 87    Quit date: 01/04/2014    Years since quitting: 6.3  . Smokeless tobacco: Never Used  Substance Use Topics   . Alcohol use: Yes    Comment: occ   Family History  Problem Relation Age of Onset  . Hypothyroidism Mother   . Atrial fibrillation Mother   . Diabetes Mother   . Colon polyps Mother   . Dementia Father   . CAD Father   . Diabetes Father   . Colon polyps Father   . Esophageal cancer Maternal Uncle   . Colon cancer Neg Hx   . Rectal cancer Neg Hx   . Stomach cancer Neg Hx    Review of Systems  Cardiovascular: Positive for leg swelling. Negative for chest pain and dyspnea on exertion.  Musculoskeletal: Positive for joint pain.  Gastrointestinal: Negative for melena.   Objective  Blood pressure 115/75, pulse 84, resp. rate 16, height '5\' 6"'  (1.676 m), weight 184 lb 9.6 oz (83.7 kg), SpO2 97 %. Body mass index is 29.8 kg/m. Vitals with BMI 05/19/2020 05/02/2020 05/02/2020  Height '5\' 6"'  - -  Weight 184 lbs 10 oz - -  BMI 91.91 - -  Systolic 660 600 459  Diastolic 75 45 76  Pulse 84 65 55    Physical Exam Cardiovascular:     Rate and Rhythm: Normal rate and regular rhythm.     Pulses: Intact distal pulses.     Heart sounds: Murmur heard.  Harsh early  systolic murmur is present with a grade of 2/6 at the apex.  No gallop.      Comments: No leg edema, no JVD. Pulmonary:     Effort: Pulmonary effort is normal.     Breath sounds: Normal breath sounds.  Abdominal:     General: Bowel sounds are normal.     Palpations: Abdomen is soft.    Laboratory examination:   CMP Latest Ref Rng & Units 01/10/2020 08/18/2018  Glucose 65 - 99 mg/dL 139(H) 98  BUN 8 - 27 mg/dL 22 11  Creatinine 0.57 - 1.00 mg/dL 0.89 0.72  Sodium 134 - 144 mmol/L 139 139  Potassium 3.5 - 5.2 mmol/L 4.7 3.4(L)  Chloride 96 - 106 mmol/L 100 104  CO2 20 - 29 mmol/L 26 22  Calcium 8.7 - 10.3 mg/dL 9.9 9.6   CBC Latest Ref Rng & Units 08/18/2018 01/04/2018  WBC 4.0 - 10.5 K/uL 7.7 6.3  Hemoglobin 12.0 - 15.0 g/dL 15.0 13.6  Hematocrit 36 - 46 % 46.7(H) 42.6  Platelets 150 - 400 K/uL 280 253   HEMOGLOBIN  A1C No results found for: HGBA1C, MPG TSH No results for input(s): TSH in the last 8760 hours.  Lipid Panel Recent Labs    05/15/20 0826 05/15/20 0831  CHOL  --  146  TRIG  --  186*  LDLCALC  --  79  HDL  --  35*  LDLDIRECT 80  --       Ref Range & Units  05/15/2020 Apolipoprotein A-1 116 - 209 mg/dL 129  Apolipoprotein B <90 mg/dL 90High  Comment:             Desirable        < 90              Borderline High   90 - 99              High        100 - 130              Very High        >130    --------------------------------------------------       ASCVD RISK       THERAPEUTIC TARGET       CATEGORY         APO B (mg/dL)      Very High Risk    <80 (if extreme risk <70)      High Risk       <90      Moderate Risk     <90  Apolipo. B/A-1 Ratio 0.0 - 0.6 ratio 0.7High    Ref Range & Units 05/15/2020 Lipoprotein (a) <75.0 nmol/L 186.8High    External labs:  Labs 11/01/2019: A1c 6.3%. Serum glucose 102 g, BUN 13, creatinine 0.92.  CMP otherwise normal. Hb 14.2/HCT 41.9, platelets 271.  Labs 04/04/2019:  Serum glucose 140 mg, BUN 10, creatinine 0.77, eGFR 82 mL.  Potassium 5.2.  CMP otherwise normal.   A1c 5.8%.  CBC normal.  Serum ferritin mildly elevated at 181.  Total cholesterol 168, triglycerides 147, HDL 46, LDL 93.  TSH normal.  Labs 12/05/2017:  Total cholesterol 333, triglycerides 255, HDL 48, LDL 234. Non-HDL cholesterol 285.  Outpatient Medications Prior to Visit  Medication Sig Dispense Refill  . acetaminophen (TYLENOL) 650 MG CR tablet Take 1,300 mg by mouth 2 (two) times daily as needed for pain.     Marland Kitchen aspirin EC 81 MG tablet  Take 81 mg by mouth daily.    . Biotin 5 MG CAPS Take 5 mg by mouth every evening.    . Cholecalciferol (VITAMIN D-3) 5000 units TABS Take 5,000 tablets by mouth every evening.     . cyclobenzaprine  (FLEXERIL) 10 MG tablet Take 10 mg by mouth 3 (three) times daily as needed for muscle spasms.    . dapagliflozin propanediol (FARXIGA) 5 MG TABS tablet Take by mouth daily.    . Ferrous Sulfate Dried (SLOW RELEASE IRON) 45 MG TBCR Take 45 tablets by mouth 2 (two) times daily.     Marland Kitchen losartan (COZAAR) 100 MG tablet Take 100 mg by mouth every evening.     Marland Kitchen omeprazole (PRILOSEC) 40 MG capsule Take 40 mg by mouth daily.    . potassium chloride SA (KLOR-CON) 20 MEQ tablet Take 40 mEq by mouth daily.    Marland Kitchen POTASSIUM PO Take 40 mEq by mouth daily. 20 meq- 2 tabs daily    . rosuvastatin (CRESTOR) 10 MG tablet Take 1 tablet (10 mg total) by mouth daily. 90 tablet 1  . senna (SENOKOT) 8.6 MG tablet Take 3 tablets by mouth daily as needed.     . tacrolimus (PROGRAF) 1 MG capsule Take 1 mg by mouth 2 (two) times daily.    Marland Kitchen torsemide (DEMADEX) 20 MG tablet Take 20 mg by mouth daily. May take extra 8m in the afternoon or evening    . tretinoin (RETIN-A) 0.025 % cream Apply 1 application topically daily as needed (blemishes).   11  . carvedilol (COREG) 6.25 MG tablet Take 1 tablet by mouth 2 (two) times daily with a meal.     No facility-administered medications prior to visit.   Meds ordered this encounter  Medications  . carvedilol (COREG) 3.125 MG tablet    Sig: Take 1 tablet (3.125 mg total) by mouth 2 (two) times daily with a meal.   Medications Discontinued During This Encounter  Medication Reason  . carvedilol (COREG) 6.25 MG tablet Reorder     Radiology:   Coronary Calcium Score 02/11/2020: 1. Three-vessel coronary artery calcification. 2. Total Agatston Score: 423 3. MESA age and sex matched database percentile: 95 4. Small RIGHT lobe pulmonary nodule. No follow-up needed if patient is low-risk. Non-contrast chest CT can be considered in 12 months if patient is high-risk.  Cardiac Studies:   Treadmill stress test [12/14/2016]:  No evidence of ischemia. Hypertensive response to  exercise.  Patient exercised for 4 minutes, 5.8 METS, peak blood pressure was 215/94 mmHg.  Achieved 94% of MPHR.  Mildly impaired exercise tolerance. No EKG changes. Low risk study.  Echocardiogram 12/16/2017: Left ventricle cavity is normal in size. Mild concentric hypertrophy of the left ventricle. Normal global wall motion. Doppler evidence of grade I (impaired) diastolic dysfunction, normal LAP. Calculated EF 51%. Mild tricuspid regurgitation. Estimated pulmonary artery systolic pressure 25 mmHg.  EKG 12/28/2019: Normal sinus rhythm at rate of 72 bpm, normal axis.  No evidence of ischemia, normal EKG.  Assessment     ICD-10-CM   1. Agatston coronary artery calcium score greater than 400  R93.1 carvedilol (COREG) 3.125 MG tablet  2. Familial hypercholesteremia  E78.01   3. Essential hypertension  I10 carvedilol (COREG) 3.125 MG tablet  4. Family history of premature CAD  Z82.49   5. Elevated Lp(a) 186 on 05/15/20  E78.41     Recommendations:   JDarlin Stenseth is a 64y.o.  female  with type II diabetes mellitus,  GERD, biopsy-proven nephrotic syndrome (FSGS NOS), on tacrolimus therapy, severe mixed hyperlipidemia related to nephrotic syndrome presents for routine visit to f/u her CV risk factors.  She is presently asymptomatic.  Lipids are completely normalized or nearly normalized with very minimal dose of Crestor which I have recommend continuing the same dose especially in view of strong family history of premature coronary artery disease.  I suspect she probably has familial hyperlipidemia in view of family history and also markedly elevated LDL of greater than 200.  However in view of markedly elevated coronary calcium score of >400, it may be worthwhile to consider addition of Zetia to her present medical regimen.  However she has started to lose weight and is making dietary changes, hence will continue present medications, exercise discussed, continued weight loss discussed, I will see  her back in a year.  Since weight loss, since patient has been on Farxiga, blood pressure is trending down.  I have reduced the dose of carvedilol from 6.25 mg to 3.125 mg twice daily.  I would like to continue losartan in view of her renal issues.  Although markedly abnormal coronary calcium score, as she remains asymptomatic although not very active, I do not think needs a stress test.  If she develops any symptoms would have a low threshold for doing this.  I have discussed the labs in detail including elevated LPA, marginal elevation in Apo B/A ratio.   Adrian Prows, MD, [facility] 05/19/2020, 10:50 PM Office: [phone]

## 2020-06-13 ENCOUNTER — Other Ambulatory Visit: Payer: Self-pay | Admitting: Cardiology

## 2020-06-13 DIAGNOSIS — I10 Essential (primary) hypertension: Secondary | ICD-10-CM

## 2020-12-26 ENCOUNTER — Other Ambulatory Visit: Payer: Self-pay

## 2020-12-26 ENCOUNTER — Ambulatory Visit: Payer: Medicare Other | Admitting: Cardiology

## 2020-12-26 ENCOUNTER — Encounter: Payer: Self-pay | Admitting: Cardiology

## 2020-12-26 VITALS — BP 110/64 | HR 79 | Temp 97.6°F | Resp 16 | Ht 66.0 in | Wt 184.4 lb

## 2020-12-26 DIAGNOSIS — R0600 Dyspnea, unspecified: Secondary | ICD-10-CM

## 2020-12-26 DIAGNOSIS — E7841 Elevated Lipoprotein(a): Secondary | ICD-10-CM

## 2020-12-26 DIAGNOSIS — I10 Essential (primary) hypertension: Secondary | ICD-10-CM

## 2020-12-26 DIAGNOSIS — R0609 Other forms of dyspnea: Secondary | ICD-10-CM

## 2020-12-26 DIAGNOSIS — R931 Abnormal findings on diagnostic imaging of heart and coronary circulation: Secondary | ICD-10-CM

## 2020-12-26 DIAGNOSIS — I209 Angina pectoris, unspecified: Secondary | ICD-10-CM

## 2020-12-26 DIAGNOSIS — E782 Mixed hyperlipidemia: Secondary | ICD-10-CM

## 2020-12-26 MED ORDER — ISOSORBIDE MONONITRATE ER 30 MG PO TB24
30.0000 mg | ORAL_TABLET | Freq: Every day | ORAL | 2 refills | Status: DC
Start: 2020-12-26 — End: 2021-02-04

## 2020-12-26 NOTE — [place] Notes (Signed)
Primary Physician/Referring:  Aura Dials, MD  Patient ID: Mallory Lewis, female    DOB: 05/31/1956, 65 y.o.   MRN: [id number]  Chief Complaint  Patient presents with  . Hypertension  . Follow-up    1 year   HPI: Mallory Lewis  is a 65 y.o. female  with type II diabetes mellitus, GERD, biopsy-proven nephrotic syndrome (FSGS NOS), on tacrolimus therapy, severe mixed hyperlipidemia related to nephrotic syndrome presents for routine visit to f/u her CV risk factors.   Patient with a past few weeks has noticed exertional chest pain in the form of tightness in the middle of the chest associated with marked dyspnea.  Her husband is presently at [facility] long hospital undergoing evaluation for non-Hodgkin's lymphoma and she has had significant difficulty walking from the parking lot due to chest pain and dyspnea.  This is a new symptom.  She has been doing well from nephrotic syndrome states that she is in remission and recently saw Dr. Madelon Lewis.   Past Medical History:  Diagnosis Date  . Allergy   . Anemia   . Arthritis   . Blood transfusion without reported diagnosis   . Cancer ([place])    skin cancer with MOHS   . Chronic kidney disease   . Constipation    uses Senna daily   . Diabetes mellitus without complication ([place])   . GERD (gastroesophageal reflux disease)   . Heart murmur    acute nephrotic syndrome   . HLD (hyperlipidemia) 06/20/2019  . HTN (hypertension) 06/20/2019  . Hypertension   . Osteopenia     Past Surgical History:  Procedure Laterality Date  . ABDOMINAL HYSTERECTOMY    . APPENDECTOMY    . COLONOSCOPY    . mohs procedure    . PELVIC FLOOR REPAIR    . POLYPECTOMY    . TOTAL HIP ARTHROPLASTY Right 08/29/2018   Procedure: TOTAL HIP ARTHROPLASTY ANTERIOR APPROACH;  Surgeon: Melrose Nakayama, MD;  Location: [facility];  Service: Orthopedics;  Laterality: Right;   Social History   Tobacco Use  . Smoking status: Former Smoker    Packs/day: 0.25     Years: 20.00    Pack years: 5.00    Types: Cigarettes    Start date: 58    Quit date: 01/04/2014    Years since quitting: 6.9  . Smokeless tobacco: Never Used  Substance Use Topics  . Alcohol use: Yes    Comment: occ   Family History  Problem Relation Age of Onset  . Hypothyroidism Mother   . Atrial fibrillation Mother   . Diabetes Mother   . Colon polyps Mother   . Dementia Father   . CAD Father   . Diabetes Father   . Colon polyps Father   . Esophageal cancer Maternal Uncle   . Colon cancer Neg Hx   . Rectal cancer Neg Hx   . Stomach cancer Neg Hx    Review of Systems  Cardiovascular: Positive for chest pain, dyspnea on exertion and leg swelling (occasinal).  Musculoskeletal: Positive for joint pain.  Gastrointestinal: Negative for melena.   Objective  Blood pressure 110/64, pulse 79, temperature 97.6 F (36.4 C), temperature source Temporal, resp. rate 16, height _0  (1.676 m), weight 184 lb 6.4 oz (83.6 kg), SpO2 93 %. Body mass index is 29.76 kg/m. Vitals with BMI 12/26/2020 05/19/2020 05/02/2020  Height _1  _2  -  Weight 184 lbs 6 oz 184 lbs 10 oz -  BMI 29.78 29.81 -  Systolic 546 503 546  Diastolic 64 75 45  Pulse 79 84 65    Physical Exam Cardiovascular:     Rate and Rhythm: Normal rate and regular rhythm.     Pulses: Intact distal pulses.     Heart sounds: Murmur heard.   Harsh early systolic murmur is present with a grade of 2/6 at the apex. No gallop.      Comments: No leg edema, no JVD. Pulmonary:     Effort: Pulmonary effort is normal.     Breath sounds: Normal breath sounds.  Abdominal:     General: Bowel sounds are normal.     Palpations: Abdomen is soft.    Laboratory examination:   CMP Latest Ref Rng & Units 01/10/2020 08/18/2018  Glucose 65 - 99 mg/dL 139(H) 98  BUN 8 - 27 mg/dL 22 11  Creatinine 0.57 - 1.00 mg/dL 0.89 0.72  Sodium 134 - 144 mmol/L 139 139  Potassium 3.5 - 5.2 mmol/L 4.7 3.4(L)  Chloride 96 - 106 mmol/L 100 104   CO2 20 - 29 mmol/L 26 22  Calcium 8.7 - 10.3 mg/dL 9.9 9.6   CBC Latest Ref Rng & Units 08/18/2018 01/04/2018  WBC 4.0 - 10.5 K/uL 7.7 6.3  Hemoglobin 12.0 - 15.0 g/dL 15.0 13.6  Hematocrit 36.0 - 46.0 % 46.7(H) 42.6  Platelets 150 - 400 K/uL 280 253   HEMOGLOBIN A1C No results found for: HGBA1C, MPG TSH No results for input(s): TSH in the last 8760 hours.  Lipid Panel Recent Labs    05/15/20 0826 05/15/20 0831  CHOL  --  146  TRIG  --  186*  LDLCALC  --  79  HDL  --  35*  LDLDIRECT 80  --       Ref Range & Units  05/15/2020 Apolipoprotein A-1 116 - 209 mg/dL 129  Apolipoprotein B <90 mg/dL 90High  Comment:             Desirable        < 90              Borderline High   90 - 99              High        100 - 130              Very High        >130    --------------------------------------------------       ASCVD RISK       THERAPEUTIC TARGET       CATEGORY         APO B (mg/dL)      Very High Risk    <80 (if extreme risk <70)      High Risk       <90      Moderate Risk     <90  Apolipo. B/A-1 Ratio 0.0 - 0.6 ratio 0.7High    Ref Range & Units 05/15/2020 Lipoprotein (a) <75.0 nmol/L 186.8High    External labs:  Labs 11/01/2019: A1c 6.3%. Serum glucose 102 g, BUN 13, creatinine 0.92.  CMP otherwise normal. Hb 14.2/HCT 41.9, platelets 271.  Labs 04/04/2019:  Serum glucose 140 mg, BUN 10, creatinine 0.77, eGFR 82 mL.  Potassium 5.2.  CMP otherwise normal.   A1c 5.8%.  CBC normal.  Serum ferritin mildly elevated at 181.  Total cholesterol 168, triglycerides 147, HDL 46, LDL 93.  TSH normal.  Labs 12/05/2017:  Total cholesterol 333,  triglycerides 255, HDL 48, LDL 234. Non-HDL cholesterol 285.  Outpatient Medications Prior to Visit  Medication Sig Dispense Refill  . acetaminophen (TYLENOL) 650 MG CR tablet Take 1,300 mg by mouth  2 (two) times daily as needed for pain.     Marland Kitchen aspirin EC 81 MG tablet Take 81 mg by mouth daily.    . Biotin 5 MG CAPS Take 5 mg by mouth every evening.    . carvedilol (COREG) 3.125 MG tablet Take 1 tablet (3.125 mg total) by mouth 2 (two) times daily with a meal.    . Cholecalciferol (VITAMIN D-3) 5000 units TABS Take 5,000 tablets by mouth every evening.     . cyclobenzaprine (FLEXERIL) 10 MG tablet Take 10 mg by mouth 3 (three) times daily as needed for muscle spasms.    . dapagliflozin propanediol (FARXIGA) 5 MG TABS tablet Take by mouth daily.    . Ferrous Sulfate Dried 45 MG TBCR Take 45 tablets by mouth 2 (two) times daily.     Marland Kitchen losartan (COZAAR) 100 MG tablet Take 100 mg by mouth every evening.     Marland Kitchen omeprazole (PRILOSEC) 40 MG capsule Take 40 mg by mouth daily.    . rosuvastatin (CRESTOR) 10 MG tablet Take 1 tablet (10 mg total) by mouth daily. 90 tablet 1  . senna (SENOKOT) 8.6 MG tablet Take 2 tablets by mouth daily as needed.    . tacrolimus (PROGRAF) 1 MG capsule Take 1 mg by mouth 2 (two) times daily.    Marland Kitchen torsemide (DEMADEX) 20 MG tablet Take 20 mg by mouth daily. May take extra 34m in the afternoon or evening    . tretinoin (RETIN-A) 0.025 % cream Apply 1 application topically daily as needed (blemishes).   11  . potassium chloride SA (KLOR-CON) 20 MEQ tablet Take 40 mEq by mouth 2 (two) times daily.    .Marland KitchenPOTASSIUM PO Take 40 mEq by mouth daily. 20 meq- 2 tabs daily     No facility-administered medications prior to visit.   Meds ordered this encounter  Medications  . isosorbide mononitrate (IMDUR) 30 MG 24 hr tablet    Sig: Take 1 tablet (30 mg total) by mouth daily.    Dispense:  30 tablet    Refill:  2   Medications Discontinued During This Encounter  Medication Reason  . POTASSIUM PO Error     Radiology:   Coronary Calcium Score 02/11/2020: 1. Three-vessel coronary artery calcification. 2. Total Agatston Score: 423 3. MESA age and sex matched database  percentile: 95 4. Small RIGHT lobe pulmonary nodule. No follow-up needed if patient is low-risk. Non-contrast chest CT can be considered in 12 months if patient is high-risk.  Cardiac Studies:   Treadmill stress test [12/14/2016]:  No evidence of ischemia. Hypertensive response to exercise.  Patient exercised for 4 minutes, 5.8 METS, peak blood pressure was 215/94 mmHg.  Achieved 94% of MPHR.  Mildly impaired exercise tolerance. No EKG changes. Low risk study.  Echocardiogram 12/16/2017: Left ventricle cavity is normal in size. Mild concentric hypertrophy of the left ventricle. Normal global wall motion. Doppler evidence of grade I (impaired) diastolic dysfunction, normal LAP. Calculated EF 51%. Mild tricuspid regurgitation. Estimated pulmonary artery systolic pressure 25 mmHg.  EKG    EKG 12/26/2020: Normal sinus rhythm at rate of 75 bpm, normal axis, incomplete right bundle branch block.  No evidence of ischemia.  Normal QT interval.   No significant change from 12/28/2019   Assessment  ICD-10-CM   1. Angina pectoris (HCC)  I20.9 PCV MYOCARDIAL PERFUSION WO LEXISCAN    PCV ECHOCARDIOGRAM COMPLETE    isosorbide mononitrate (IMDUR) 30 MG 24 hr tablet  2. Agatston coronary artery calcium score greater than 400  R93.1   3. Elevated Lp(a) 186 on 05/15/20  E78.41   4. Mixed hyperlipidemia  E78.2   5. Essential hypertension  I10 EKG 12-Lead  6. Dyspnea on exertion  R06.00 PCV ECHOCARDIOGRAM COMPLETE    Recommendations:   Tyreesha Maharaj  is a 66 y.o.  female  with type II diabetes mellitus, GERD, biopsy-proven nephrotic syndrome (FSGS NOS), on tacrolimus therapy, severe mixed hyperlipidemia related to nephrotic syndrome, markedly elevated coronary Agatston score of >400 noted in May 2021, presents for annual visit.  Patient with a past few weeks has noticed exertional chest pain in the form of tightness in the middle of the chest associated with marked dyspnea.  Her husband is  presently at [facility] long hospital undergoing evaluation for non-Hodgkin's lymphoma and she has had significant difficulty walking from the parking lot due to chest pain and dyspnea.  This is a new symptom.  In view of her risk factors, symptoms suggestive of angina, I have recommended a exercise nuclear stress test and also repeating the echocardiogram to evaluate her dyspnea.  She is presently on aspirin, continue the same.  I will add isosorbide mononitrate 30 mg daily, her blood pressure is soft and she is on minimal dose of beta-blocker.  With statin therapy, lipids are completely normalized.  She has responded very well to high intensity low-dose statins, previously LDL was >200 prior to initiation of statin therapy.  Also her coronary accident score is >400.  I will see her back in 3 to 4 weeks for follow-up.  She will contact me if her chest pain symptoms are more frequent and or not relieved with rest.   Adrian Prows, MD, [facility] 12/26/2020, 1:31 PM Office: [phone]

## 2021-01-07 ENCOUNTER — Ambulatory Visit: Payer: Medicare Other

## 2021-01-07 ENCOUNTER — Other Ambulatory Visit: Payer: Self-pay

## 2021-01-07 DIAGNOSIS — R0609 Other forms of dyspnea: Secondary | ICD-10-CM

## 2021-01-07 DIAGNOSIS — I209 Angina pectoris, unspecified: Secondary | ICD-10-CM

## 2021-01-07 DIAGNOSIS — R0600 Dyspnea, unspecified: Secondary | ICD-10-CM

## 2021-01-12 ENCOUNTER — Ambulatory Visit: Payer: Medicare Other

## 2021-01-12 ENCOUNTER — Other Ambulatory Visit: Payer: Self-pay

## 2021-01-12 DIAGNOSIS — I209 Angina pectoris, unspecified: Secondary | ICD-10-CM

## 2021-01-13 ENCOUNTER — Other Ambulatory Visit: Payer: Self-pay

## 2021-01-13 MED ORDER — EZETIMIBE 10 MG PO TABS: 10.0000 mg | ORAL_TABLET | Freq: Every day | ORAL | 3 refills | Status: DC

## 2021-01-13 NOTE — Telephone Encounter (Signed)
Send for Zetia 10 mg 90 days with 3 refills to be taken after supper.

## 2021-02-04 ENCOUNTER — Encounter: Payer: Self-pay | Admitting: Cardiology

## 2021-02-04 ENCOUNTER — Other Ambulatory Visit: Payer: Self-pay

## 2021-02-04 ENCOUNTER — Ambulatory Visit: Payer: Medicare Other | Admitting: Cardiology

## 2021-02-04 VITALS — BP 101/67 | HR 85 | Temp 98.5°F | Resp 17 | Ht 66.0 in | Wt 184.4 lb

## 2021-02-04 DIAGNOSIS — R0609 Other forms of dyspnea: Secondary | ICD-10-CM

## 2021-02-04 DIAGNOSIS — I209 Angina pectoris, unspecified: Secondary | ICD-10-CM

## 2021-02-04 DIAGNOSIS — R931 Abnormal findings on diagnostic imaging of heart and coronary circulation: Secondary | ICD-10-CM

## 2021-02-04 DIAGNOSIS — E782 Mixed hyperlipidemia: Secondary | ICD-10-CM

## 2021-02-04 DIAGNOSIS — R0600 Dyspnea, unspecified: Secondary | ICD-10-CM

## 2021-02-04 MED ORDER — ISOSORBIDE MONONITRATE ER 30 MG PO TB24
30.0000 mg | ORAL_TABLET | Freq: Every day | ORAL | 3 refills | Status: DC
Start: 2021-02-04 — End: 2021-03-24

## 2021-02-04 NOTE — Progress Notes (Signed)
Primary Physician/Referring:  Aura Dials, MD  Patient ID: Mallory Lewis, female    DOB: [birth year], 65 y.o.   MRN: [id number]  Chief Complaint  Patient presents with  . Follow-up    4 weeks  . Coronary Artery Disease  . Hypertension  . Hyperlipidemia   HPI: Mallory Lewis  is a 65 y.o. female  with type II diabetes mellitus, GERD, biopsy-proven nephrotic syndrome (FSGS NOS), on tacrolimus therapy, severe mixed hyperlipidemia related to nephrotic syndrome presents for routine visit to f/u her CV risk factors.   On her last office visit due to exertional chest tightness suggestive of angina I started her on isosorbide mononitrate and ordered a nuclear stress test.  She is noticed marked improvement in chest tightness and also dyspnea since being on Imdur and has not had any recurrence.  Nuclear stress test fortunately is low risk.  We will continue present medical therapy for now.  She has been doing well from nephrotic syndrome states that she is in remission and recently saw Dr. Madelon Lips.   Past Medical History:  Diagnosis Date  . Allergy   . Anemia   . Arthritis   . Blood transfusion without reported diagnosis   . Cancer ([place])    skin cancer with MOHS   . Chronic kidney disease   . Constipation    uses Senna daily   . Diabetes mellitus without complication ([place])   . GERD (gastroesophageal reflux disease)   . Heart murmur    acute nephrotic syndrome   . HLD (hyperlipidemia) 06/20/2019  . HTN (hypertension) 06/20/2019  . Hypertension   . Osteopenia     Past Surgical History:  Procedure Laterality Date  . ABDOMINAL HYSTERECTOMY    . APPENDECTOMY    . COLONOSCOPY    . mohs procedure    . PELVIC FLOOR REPAIR    . POLYPECTOMY    . TOTAL HIP ARTHROPLASTY Right 08/29/2018   Procedure: TOTAL HIP ARTHROPLASTY ANTERIOR APPROACH;  Surgeon: Melrose Nakayama, MD;  Location: [facility];  Service: Orthopedics;  Laterality: Right;   Social History   Tobacco Use  .  Smoking status: Former Smoker    Packs/day: 0.25    Years: 20.00    Pack years: 5.00    Types: Cigarettes    Start date: 63    Quit date: 01/04/2014    Years since quitting: 7.0  . Smokeless tobacco: Never Used  Substance Use Topics  . Alcohol use: Yes    Comment: occ   Family History  Problem Relation Age of Onset  . Hypothyroidism Mother   . Atrial fibrillation Mother   . Diabetes Mother   . Colon polyps Mother   . Dementia Father   . CAD Father   . Diabetes Father   . Colon polyps Father   . Esophageal cancer Maternal Uncle   . Colon cancer Neg Hx   . Rectal cancer Neg Hx   . Stomach cancer Neg Hx    Review of Systems  Cardiovascular: Positive for chest pain, dyspnea on exertion and leg swelling (occasinal).  Musculoskeletal: Positive for joint pain.  Gastrointestinal: Negative for melena.   Objective  Blood pressure 101/67, pulse 85, temperature 98.5 F (36.9 C), temperature source Temporal, resp. rate 17, height '5\' 6"'  (1.676 m), weight 184 lb 6.4 oz (83.6 kg), SpO2 96 %. Body mass index is 29.76 kg/m. Vitals with BMI 02/04/2021 12/26/2020 05/19/2020  Height '5\' 6"'  '5\' 6"'  '5\' 6"'   Weight 184 lbs 6 oz  184 lbs 6 oz 184 lbs 10 oz  BMI 29.78 57.01 77.93  Systolic 903 009 233  Diastolic 67 64 75  Pulse 85 79 84    Physical Exam Cardiovascular:     Rate and Rhythm: Normal rate and regular rhythm.     Pulses: Intact distal pulses.     Heart sounds: Murmur heard.   Harsh early systolic murmur is present with a grade of 2/6 at the apex. No gallop.      Comments: No leg edema, no JVD. Pulmonary:     Effort: Pulmonary effort is normal.     Breath sounds: Normal breath sounds.  Abdominal:     General: Bowel sounds are normal.     Palpations: Abdomen is soft.    Laboratory examination:   CMP Latest Ref Rng & Units 01/10/2020 08/18/2018  Glucose 65 - 99 mg/dL 139(H) 98  BUN 8 - 27 mg/dL 22 11  Creatinine 0.57 - 1.00 mg/dL 0.89 0.72  Sodium 134 - 144 mmol/L 139 139   Potassium 3.5 - 5.2 mmol/L 4.7 3.4(L)  Chloride 96 - 106 mmol/L 100 104  CO2 20 - 29 mmol/L 26 22  Calcium 8.7 - 10.3 mg/dL 9.9 9.6   CBC Latest Ref Rng & Units 08/18/2018 01/04/2018  WBC 4.0 - 10.5 K/uL 7.7 6.3  Hemoglobin 12.0 - 15.0 g/dL 15.0 13.6  Hematocrit 36.0 - 46.0 % 46.7(H) 42.6  Platelets 150 - 400 K/uL 280 253   HEMOGLOBIN A1C No results found for: HGBA1C, MPG TSH No results for input(s): TSH in the last 8760 hours.  Lipid Panel Recent Labs    05/15/20 0826 05/15/20 0831  CHOL  --  146  TRIG  --  186*  LDLCALC  --  79  HDL  --  35*  LDLDIRECT 80  --       Ref Range & Units  05/15/2020 Apolipoprotein A-1 116 - 209 mg/dL 129  Apolipoprotein B <90 mg/dL 90High  Comment:             Desirable        < 90              Borderline High   90 - 99              High        100 - 130              Very High        >130    --------------------------------------------------       ASCVD RISK       THERAPEUTIC TARGET       CATEGORY         APO B (mg/dL)      Very High Risk    <80 (if extreme risk <70)      High Risk       <90      Moderate Risk     <90  Apolipo. B/A-1 Ratio 0.0 - 0.6 ratio 0.7High    Ref Range & Units 05/15/2020 Lipoprotein (a) <75.0 nmol/L 186.8High    External labs:  Labs 11/01/2019: A1c 6.3%. Serum glucose 102 g, BUN 13, creatinine 0.92.  CMP otherwise normal. Hb 14.2/HCT 41.9, platelets 271.  Labs 04/04/2019:  Serum glucose 140 mg, BUN 10, creatinine 0.77, eGFR 82 mL.  Potassium 5.2.  CMP otherwise normal.   A1c 5.8%.  CBC normal.  Serum ferritin mildly elevated at 181.  Total cholesterol 168, triglycerides 147,  HDL 46, LDL 93.  TSH normal.  Labs 12/05/2017:  Total cholesterol 333, triglycerides 255, HDL 48, LDL 234. Non-HDL cholesterol 285.  Outpatient Medications Prior to Visit  Medication Sig Dispense  Refill  . acetaminophen (TYLENOL) 650 MG CR tablet Take 1,300 mg by mouth 2 (two) times daily as needed for pain.     Marland Kitchen aspirin EC 81 MG tablet Take 81 mg by mouth daily.    . Biotin 5 MG CAPS Take 5 mg by mouth every evening.    . carvedilol (COREG) 3.125 MG tablet Take 1 tablet (3.125 mg total) by mouth 2 (two) times daily with a meal.    . Cholecalciferol (VITAMIN D-3) 5000 units TABS Take 5,000 tablets by mouth every evening.     . cyclobenzaprine (FLEXERIL) 10 MG tablet Take 10 mg by mouth 3 (three) times daily as needed for muscle spasms.    . dapagliflozin propanediol (FARXIGA) 5 MG TABS tablet Take by mouth daily.    Marland Kitchen ezetimibe (ZETIA) 10 MG tablet Take 1 tablet (10 mg total) by mouth daily. take after dinner 90 tablet 3  . Ferrous Sulfate Dried 45 MG TBCR Take 45 tablets by mouth 2 (two) times daily.     Marland Kitchen losartan (COZAAR) 100 MG tablet Take 100 mg by mouth every evening.     Marland Kitchen omeprazole (PRILOSEC) 40 MG capsule Take 40 mg by mouth daily.    . potassium chloride SA (KLOR-CON) 20 MEQ tablet Take 40 mEq by mouth 2 (two) times daily.    . rosuvastatin (CRESTOR) 10 MG tablet Take 1 tablet (10 mg total) by mouth daily. 90 tablet 1  . senna (SENOKOT) 8.6 MG tablet Take 2 tablets by mouth daily as needed.    . tacrolimus (PROGRAF) 1 MG capsule Take 1 mg by mouth 2 (two) times daily.    Marland Kitchen torsemide (DEMADEX) 20 MG tablet Take 20 mg by mouth daily. May take extra 84m in the afternoon or evening    . tretinoin (RETIN-A) 0.025 % cream Apply 1 application topically daily as needed (blemishes).   11  . isosorbide mononitrate (IMDUR) 30 MG 24 hr tablet Take 1 tablet (30 mg total) by mouth daily. 30 tablet 2   No facility-administered medications prior to visit.   Meds ordered this encounter  Medications  . isosorbide mononitrate (IMDUR) 30 MG 24 hr tablet    Sig: Take 1 tablet (30 mg total) by mouth daily.    Dispense:  90 tablet    Refill:  3   Medications Discontinued During This  Encounter  Medication Reason  . isosorbide mononitrate (IMDUR) 30 MG 24 hr tablet Reorder     Radiology:   Coronary Calcium Score 02/11/2020: 1. Three-vessel coronary artery calcification. 2. Total Agatston Score: 423 3. MESA age and sex matched database percentile: 95 4. Small RIGHT lobe pulmonary nodule. No follow-up needed if patient is low-risk. Non-contrast chest CT can be considered in 12 months if patient is high-risk.  Cardiac Studies:   Echocardiogram 01/07/2021: Normal LV systolic function with visual EF 60-65%. Left ventricle cavity is normal in size. Mild left ventricular hypertrophy. Normal global wall motion. Normal diastolic filling pattern, normal LAP.  Aortic valve sclerosis without stenosis. Mild tricuspid regurgitation. No evidence of pulmonary hypertension. Compared to prior study dated 12/16/2017: no significant change.   Exercise Myoview stress test 01/12/2021: Exercise nuclear stress test was performed using Bruce protocol. Patient reached 5.8 METS, and 90% of age predicted maximum heart rate. Exercise capacity  was low. Non-limiting chest pain reported. Heart rate and hemodynamic response were normal. Stress EKG revealed no ischemic changes. Normal myocardial perfusion. Stress LVEF 60%. Low risk study.  EKG    EKG 12/26/2020: Normal sinus rhythm at rate of 75 bpm, normal axis, incomplete right bundle branch block.  No evidence of ischemia.  Normal QT interval.   No significant change from 12/28/2019   Assessment     ICD-10-CM   1. Angina pectoris (HCC)  I20.9 isosorbide mononitrate (IMDUR) 30 MG 24 hr tablet  2. Dyspnea on exertion  R06.00   3. Agatston coronary artery calcium score greater than 400  R93.1   4. Mixed hyperlipidemia  E78.2     Recommendations:   Mallory Lewis  is a 65 y.o.  female  with type II diabetes mellitus, GERD, biopsy-proven nephrotic syndrome (FSGS NOS), on tacrolimus therapy, severe mixed hyperlipidemia related to nephrotic  syndrome, markedly elevated coronary Agatston score of >400 noted in May 2021, presents for 6-week visit.  On her last office visit due to exertional chest tightness suggestive of angina I started her on isosorbide mononitrate and ordered a nuclear stress test.  She is noticed marked improvement in chest tightness and also dyspnea since being on Imdur and has not had any recurrence.  Nuclear stress test fortunately is low risk.  We will continue present medical therapy for now.  She is now tolerating Zetia as well as high intensity low-dose statins without any side effects.  Lipids are at goal.  In view of her symptoms of chest pain and also high coronary calcium score, continue to monitor LDL goal LDL <70.  I will see her back in 6 months unless she has recurrence of chest pain.  I reassured her.  Adrian Prows, MD, [facility] 02/04/2021, 1:56 PM Office: [phone]

## 2021-03-24 ENCOUNTER — Other Ambulatory Visit: Payer: Self-pay | Admitting: Cardiology

## 2021-03-24 DIAGNOSIS — I209 Angina pectoris, unspecified: Secondary | ICD-10-CM

## 2021-04-22 ENCOUNTER — Other Ambulatory Visit: Payer: Self-pay | Admitting: Family Medicine

## 2021-04-22 DIAGNOSIS — R911 Solitary pulmonary nodule: Secondary | ICD-10-CM

## 2021-05-02 ENCOUNTER — Ambulatory Visit
Admission: RE | Admit: 2021-05-02 | Discharge: 2021-05-02 | Disposition: A | Discharge status: Home / Self Care | Payer: Medicare Other | Source: Ambulatory Visit | Attending: Family Medicine | Admitting: Family Medicine

## 2021-05-02 ENCOUNTER — Other Ambulatory Visit: Payer: Self-pay

## 2021-05-02 DIAGNOSIS — R911 Solitary pulmonary nodule: Secondary | ICD-10-CM

## 2021-05-21 ENCOUNTER — Ambulatory Visit: Admitting: Cardiology

## 2021-07-13 ENCOUNTER — Ambulatory Visit: Payer: Medicare Other | Admitting: Cardiology

## 2021-07-13 ENCOUNTER — Encounter: Payer: Self-pay | Admitting: Cardiology

## 2021-07-13 ENCOUNTER — Other Ambulatory Visit: Payer: Self-pay

## 2021-07-13 VITALS — BP 104/68 | HR 82 | Temp 98.3°F | Resp 16 | Ht 66.0 in | Wt 182.2 lb

## 2021-07-13 DIAGNOSIS — I209 Angina pectoris, unspecified: Secondary | ICD-10-CM

## 2021-07-13 DIAGNOSIS — E782 Mixed hyperlipidemia: Secondary | ICD-10-CM

## 2021-07-13 DIAGNOSIS — R931 Abnormal findings on diagnostic imaging of heart and coronary circulation: Secondary | ICD-10-CM

## 2021-07-13 DIAGNOSIS — E7841 Elevated Lipoprotein(a): Secondary | ICD-10-CM

## 2021-07-13 DIAGNOSIS — I10 Essential (primary) hypertension: Secondary | ICD-10-CM

## 2021-07-13 NOTE — Progress Notes (Signed)
Primary Physician/Referring:  Aura Dials, MD  Patient ID: Mallory Lewis, female    DOB: December 08, 1955, 65 y.o.   MRN: [id number]  Chief Complaint  Patient presents with   Coronary Artery Disease   Hyperlipidemia   Follow-up    1 year    HPI: Mallory Lewis  is a 65 y.o. female  with type II diabetes mellitus, GERD, biopsy-proven nephrotic syndrome (FSGS NOS), on tacrolimus therapy, severe mixed hyperlipidemia related to nephrotic syndrome, elevated LP(a), elevated coronary calcium score in the 95th percentile, family history of premature CAD in her brother at age 66Y  presents for annual visit for angina pectoris.   Patient has had 1 episode of angina pectoris with chest tightness with radiation to her jaw but has had no further recurrence 2 weeks ago.  Past Medical History:  Diagnosis Date   Allergy    Anemia    Arthritis    Blood transfusion without reported diagnosis    Cancer ([place])    skin cancer with MOHS    Chronic kidney disease    Constipation    uses Senna daily    Diabetes mellitus without complication (HCC)    GERD (gastroesophageal reflux disease)    Heart murmur    acute nephrotic syndrome    HLD (hyperlipidemia) 06/20/2019   HTN (hypertension) 06/20/2019   Hypertension    Osteopenia     Past Surgical History:  Procedure Laterality Date   ABDOMINAL HYSTERECTOMY     APPENDECTOMY     COLONOSCOPY     mohs procedure     PELVIC FLOOR REPAIR     POLYPECTOMY     TOTAL HIP ARTHROPLASTY Right 08/29/2018   Procedure: TOTAL HIP ARTHROPLASTY ANTERIOR APPROACH;  Surgeon: Melrose Nakayama, MD;  Location: [facility];  Service: Orthopedics;  Laterality: Right;   Social History   Tobacco Use   Smoking status: Former    Packs/day: 0.25    Years: 20.00    Pack years: 5.00    Types: Cigarettes    Start date: 55    Quit date: 01/04/2014    Years since quitting: 7.5   Smokeless tobacco: Never  Substance Use Topics   Alcohol use: Yes    Comment: occ   Family  History  Problem Relation Age of Onset   Hypothyroidism Mother    Atrial fibrillation Mother    Diabetes Mother    Colon polyps Mother    Dementia Father    CAD Father    Diabetes Father    Colon polyps Father    Esophageal cancer Maternal Uncle    Colon cancer Neg Hx    Rectal cancer Neg Hx    Stomach cancer Neg Hx    Review of Systems  Cardiovascular:  Positive for chest pain. Negative for dyspnea on exertion and leg swelling.  Musculoskeletal:  Positive for joint pain.  Gastrointestinal:  Negative for melena.  Objective  Blood pressure 104/68, pulse 82, temperature 98.3 F (36.8 C), temperature source Temporal, resp. rate 16, height '5\' 6"'  (1.676 m), weight 182 lb 3.2 oz (82.6 kg), SpO2 96 %. Body mass index is 29.41 kg/m. Vitals with BMI 07/13/2021 02/04/2021 12/26/2020  Height '5\' 6"'  '5\' 6"'  '5\' 6"'   Weight 182 lbs 3 oz 184 lbs 6 oz 184 lbs 6 oz  BMI 29.42 91.79 15.05  Systolic 697 948 016  Diastolic 68 67 64  Pulse 82 85 79    Physical Exam Neck:     Vascular: No carotid bruit or  JVD.  Cardiovascular:     Rate and Rhythm: Normal rate and regular rhythm.     Pulses: Normal pulses and intact distal pulses.     Heart sounds: No murmur heard.   No gallop.  Pulmonary:     Effort: Pulmonary effort is normal.     Breath sounds: Normal breath sounds.  Abdominal:     General: Bowel sounds are normal.     Palpations: Abdomen is soft.  Musculoskeletal:     Right lower leg: No edema.     Left lower leg: No edema.   Laboratory examination:     Ref Range & Units  05/15/2020 Apolipoprotein A-1 116 - 209 mg/dL 129  Apolipoprotein B <90 mg/dL 90 High   Comment:                          Desirable               < 90                           Borderline High     90 -  99                           High               100 - 130                           Very High               >130       --------------------------------------------------            ASCVD RISK               THERAPEUTIC TARGET             CATEGORY                  APO B (mg/dL)          Very High Risk        <80 (if extreme risk <70)          High Risk             <90          Moderate Risk         <90  Apolipo. B/A-1 Ratio 0.0 - 0.6 ratio 0.7 High     Ref Range & Units 05/15/2020 Lipoprotein (a) <75.0 nmol/L 186.8 High     External labs:  Cholesterol, total 150.000 M 04/08/2021 HDL 43.000 MG 04/08/2021 LDL 72.000 MG 04/08/2021 Triglycerides 210.000 M 04/08/2021  A1C 6.300 % 11/24/2020 TSH 2.890 04/04/2019  Hemoglobin 15.300 G/ 04/08/2021 Platelets 245.000 X1 04/08/2021  Creatinine, Serum 0.720 MG/ 04/08/2021 Potassium 4.700 mm 01/10/2020 ALT (SGPT) 23.000 IU/ 04/08/2021  Labs 11/01/2019: A1c 6.3%. Serum glucose 102 g, BUN 13, creatinine 0.92.  CMP otherwise normal. Hb 14.2/HCT 41.9, platelets 271.  Labs 04/04/2019:  Serum glucose 140 mg, BUN 10, creatinine 0.77, eGFR 82 mL.  Potassium 5.2.  CMP otherwise normal.   A1c 5.8%.  CBC normal.  Serum ferritin mildly elevated at 181.  Total cholesterol 168, triglycerides 147, HDL 46, LDL 93.  TSH normal.  Labs 12/05/2017:  Total cholesterol 333, triglycerides 255, HDL 48, LDL 234. Non-HDL cholesterol 285.  Outpatient Medications Prior to Visit  Medication Sig Dispense Refill   acetaminophen (TYLENOL) 650 MG CR tablet Take 1,300 mg by mouth 2 (two) times daily as needed for pain.      aspirin EC 81 MG tablet Take 81 mg by mouth daily.     Biotin 5 MG CAPS Take 5 mg by mouth every evening.     carvedilol (COREG) 3.125 MG tablet Take 1 tablet (3.125 mg total) by mouth 2 (two) times daily with a meal.     Cholecalciferol (VITAMIN D-3) 5000 units TABS Take 5,000 tablets by mouth every evening.      cyclobenzaprine (FLEXERIL) 10 MG tablet Take 10 mg by mouth 3 (three) times daily as needed for muscle spasms.     dapagliflozin propanediol (FARXIGA) 5 MG TABS tablet Take by mouth daily.     ezetimibe (ZETIA) 10 MG tablet Take 1 tablet (10 mg total)  by mouth daily. take after dinner 90 tablet 3   Ferrous Sulfate Dried 45 MG TBCR Take 45 tablets by mouth 2 (two) times daily.      isosorbide mononitrate (IMDUR) 30 MG 24 hr tablet TAKE 1 TABLET BY MOUTH EVERY DAY 90 tablet 0   losartan (COZAAR) 100 MG tablet Take 100 mg by mouth every evening.      omeprazole (PRILOSEC) 40 MG capsule Take 40 mg by mouth daily.     potassium chloride SA (KLOR-CON) 20 MEQ tablet Take 40 mEq by mouth 2 (two) times daily.     rosuvastatin (CRESTOR) 10 MG tablet Take 1 tablet (10 mg total) by mouth daily. 90 tablet 1   senna (SENOKOT) 8.6 MG tablet Take 2 tablets by mouth daily as needed.     tacrolimus (PROGRAF) 1 MG capsule Take 1 mg by mouth 2 (two) times daily.     torsemide (DEMADEX) 20 MG tablet Take 20 mg by mouth daily. May take extra 73m in the afternoon or evening     tretinoin (RETIN-A) 0.025 % cream Apply 1 application topically daily as needed (blemishes).   11   No facility-administered medications prior to visit.   No orders of the defined types were placed in this encounter.  There are no discontinued medications.    Radiology:   Coronary Calcium Score 02/11/2020: 1. Three-vessel coronary artery calcification. 2. Total Agatston Score: 423 3. MESA age and sex matched database percentile: 95 4. Small RIGHT lobe pulmonary nodule. No follow-up needed if patient is low-risk. Non-contrast chest CT can be considered in 12 months if patient is high-risk.  Cardiac Studies:   Echocardiogram 01/07/2021: Normal LV systolic function with visual EF 60-65%. Left ventricle cavity is normal in size. Mild left ventricular hypertrophy. Normal global wall motion. Normal diastolic filling pattern, normal LAP.  Aortic valve sclerosis without stenosis. Mild tricuspid regurgitation. No evidence of pulmonary hypertension. Compared to prior study dated 12/16/2017: no significant change.   Exercise Myoview stress test 01/12/2021: Exercise nuclear stress test was  performed using Bruce protocol. Patient reached 5.8 METS, and 90% of age predicted maximum heart rate. Exercise capacity was low. Non-limiting chest pain reported. Heart rate and hemodynamic response were normal. Stress EKG revealed no ischemic changes. Normal myocardial perfusion. Stress LVEF 60%. Low risk study.  EKG   EKG 07/13/2021: Normal sinus rhythm at rate of 75 bpm, normal axis.  Incomplete right bundle branch block.  Normal EKG. no significant change from 12/26/2020.   Assessment     ICD-10-CM   1. Angina pectoris ([place]  I20.9     2. Agatston coronary artery calcium score greater than 400  R93.1 EKG 12-Lead    3. Mixed hyperlipidemia  E78.2     4. Elevated Lp(a) 186 on 05/15/20  E78.41     5. Essential hypertension  I10       Recommendations:   Analia Zuk  is a 65 y.o.  with type II diabetes mellitus, GERD, biopsy-proven nephrotic syndrome (FSGS NOS), on tacrolimus therapy, severe mixed hyperlipidemia related to nephrotic syndrome, elevated LP(a), elevated coronary calcium score in the 95th percentile, family history of premature CAD in her brother at age 75Y  presents for annual visit for angina pectoris.   Due to significant cardiovascular risks, she is on aggressive medical therapy with statins along with Zetia, I reviewed her external labs, lipids and excellent control.  She continues to remain active, she has had 1 episode of angina pectoris 2 weeks ago but no further chest discomfort or chest tightness with radiation to the jaw.  Advised her to use extra Imdur if necessary.  If she has recurrence advised her that I can certainly send in for sublingual nitroglycerin.  Physical examination is unremarkable.  Blood pressure is also well controlled.  I did not make any changes to her medications, we could certainly consider Carrington Clamp in view of coronary calcification, proteinuria.  I will forward a copy of my recommendations to her nephrologist.  I will see her back in a  year or sooner if problems.   Her husband has been diagnosed with non-Hodgkin's lymphoma and presently appears to be in remission which she is very happy about.  CC: Madelon Lips, MD (Nephrology)    Adrian Prows, MD, [facility] 07/13/2021, 11:09 AM Office: [phone]

## 2021-08-18 ENCOUNTER — Ambulatory Visit: Payer: Self-pay | Admitting: General Surgery

## 2021-08-18 DIAGNOSIS — K8020 Calculus of gallbladder without cholecystitis without obstruction: Secondary | ICD-10-CM

## 2021-09-09 NOTE — Patient Instructions (Addendum)
DUE TO COVID-19 ONLY ONE VISITOR IS ALLOWED TO COME WITH YOU AND STAY IN THE WAITING ROOM ONLY DURING PRE OP AND PROCEDURE.   **NO VISITORS ARE ALLOWED IN THE SHORT STAY AREA OR RECOVERY ROOM!!**       Your procedure is scheduled on: 09/17/21   Report to [facility] Main Entrance    Report to admitting at 5:15 AM   Call this number if you have problems the morning of surgery [phone]   Do not eat food :After Midnight.   May have liquids until 4:30 AM day of surgery  CLEAR LIQUID DIET  Foods Allowed                                                                     Foods Excluded  Water, Black Coffee and tea (no milk or creamer)           liquids that you cannot  Plain Jell-O in any flavor  (No red)                                    see through such as: Fruit ices (not with fruit pulp)                                            milk, soups, orange juice              Iced Popsicles (No red)                                               All solid food                                   Apple juices Sports drinks like Gatorade (No red) Lightly seasoned clear broth or consume(fat free) Sugar    Oral Hygiene is also important to reduce your risk of infection.                                    Remember - BRUSH YOUR TEETH THE MORNING OF SURGERY WITH YOUR REGULAR TOOTHPASTE   Take these medicines the morning of surgery with A SIP OF WATER: Tylenol, Coreg, Zetia, IMDUR, Omeprazole  DO NOT TAKE ANY ORAL DIABETIC MEDICATIONS DAY OF YOUR SURGERY  How to Manage Your Diabetes Before and After Surgery  Why is it important to control my blood sugar before and after surgery? Improving blood sugar levels before and after surgery helps healing and can limit problems. A way of improving blood sugar control is eating a healthy diet by:  Eating less sugar and carbohydrates  Increasing activity/exercise  Talking with your doctor about reaching your blood sugar goals High blood  sugars (greater than 180 mg/dL) can raise your risk of infections and slow your  recovery, so you will need to focus on controlling your diabetes during the weeks before surgery. Make sure that the doctor who takes care of your diabetes knows about your planned surgery including the date and location.  How do I manage my blood sugar before surgery? Check your blood sugar at least 4 times a day, starting 2 days before surgery, to make sure that the level is not too high or low. Check your blood sugar the morning of your surgery when you wake up and every 2 hours until you get to the Short Stay unit. If your blood sugar is less than 70 mg/dL, you will need to treat for low blood sugar: Do not take insulin. Treat a low blood sugar (less than 70 mg/dL) with  cup of clear juice (cranberry or apple), 4 glucose tablets, OR glucose gel. Recheck blood sugar in 15 minutes after treatment (to make sure it is greater than 70 mg/dL). If your blood sugar is not greater than 70 mg/dL on recheck, call [phone] for further instructions. Report your blood sugar to the short stay nurse when you get to Short Stay.  If you are admitted to the hospital after surgery: Your blood sugar will be checked by the staff and you will probably be given insulin after surgery (instead of oral diabetes medicines) to make sure you have good blood sugar levels. The goal for blood sugar control after surgery is 80-180 mg/dL.   WHAT DO I DO ABOUT MY DIABETES MEDICATION?  Do not take oral diabetes medicines (pills) the morning of surgery.  THE DAY BEFORE SURGERY, do not take [place].       THE MORNING OF SURGERY, do not take [place] and Endorsed by [facility] Patient Education Committee, August 2015                               You may not have any metal on your body including hair pins, jewelry, and body piercing             Do not wear make-up, lotions, powders, perfumes, or deodorant  Do not wear nail  polish including gel and S&S, artificial/acrylic nails, or any other type of covering on natural nails including finger and toenails. If you have artificial nails, gel coating, etc. that needs to be removed by a nail salon please have this removed prior to surgery or surgery may need to be canceled/ delayed if the surgeon/ anesthesia feels like they are unable to be safely monitored.   Do not shave  48 hours prior to surgery.    Do not bring valuables to the hospital. [place].    Patients discharged on the day of surgery will not be allowed to drive home.   Special Instructions: Bring a copy of your healthcare power of attorney and living will documents         the day of surgery if you haven't scanned them before.              Please read over the following fact sheets you were given: IF YOU HAVE QUESTIONS ABOUT YOUR PRE-OP INSTRUCTIONS PLEASE CALL [facility] - Preparing for Surgery Before surgery, you can play an important role.  Because skin is not sterile, your skin needs to be as  free of germs as possible.  You can reduce the number of germs on your skin by washing with CHG (chlorahexidine gluconate) soap before surgery.  CHG is an antiseptic cleaner which kills germs and bonds with the skin to continue killing germs even after washing. Please DO NOT use if you have an allergy to CHG or antibacterial soaps.  If your skin becomes reddened/irritated stop using the CHG and inform your nurse when you arrive at Short Stay. Do not shave (including legs and underarms) for at least 48 hours prior to the first CHG shower.  You may shave your face/neck.  Please follow these instructions carefully:  1.  Shower with CHG Soap the night before surgery and the  morning of surgery.  2.  If you choose to wash your hair, wash your hair first as usual with your normal  shampoo.  3.  After you shampoo, rinse your hair and body  thoroughly to remove the shampoo.                             4.  Use CHG as you would any other liquid soap.  You can apply chg directly to the skin and wash.  Gently with a scrungie or clean washcloth.  5.  Apply the CHG Soap to your body ONLY FROM THE NECK DOWN.   Do   not use on face/ open                           Wound or open sores. Avoid contact with eyes, ears mouth and   genitals (private parts).                       Wash face,  Genitals (private parts) with your normal soap.             6.  Wash thoroughly, paying special attention to the area where your    surgery  will be performed.  7.  Thoroughly rinse your body with warm water from the neck down.  8.  DO NOT shower/wash with your normal soap after using and rinsing off the CHG Soap.                9.  Pat yourself dry with a clean towel.            10.  Wear clean pajamas.            11.  Place clean sheets on your bed the night of your first shower and do not  sleep with pets. Day of Surgery : Do not apply any lotions/deodorants the morning of surgery.  Please wear clean clothes to the hospital/surgery center.  FAILURE TO FOLLOW THESE INSTRUCTIONS MAY RESULT IN THE CANCELLATION OF YOUR SURGERY  PATIENT SIGNATURE_________________________________  NURSE SIGNATURE__________________________________  ________________________________________________________________________

## 2021-09-09 NOTE — Progress Notes (Addendum)
COVID swab appointment: n/a  COVID Vaccine Completed: yes x3 Date COVID Vaccine completed: Has received booster: COVID vaccine manufacturer: [employer]   Date of COVID positive in last 90 days: no  PCP - Aura Dials, MD Cardiologist - Adrian Prows, MD Nephrologist- Madelon Lips, MD  Chest x-ray - CT 05/02/21 Epic EKG - 07/13/21 Epic Stress Test - 01/12/21 Epic ECHO - 01/07/21 Epic Cardiac Cath - n/a Pacemaker/ICD device last checked: n/a Spinal Cord Stimulator: n/a  Sleep Study - n/a CPAP -   Fasting Blood Sugar - 120-140 Checks Blood Sugar _1_ times a week  Blood Thinner Instructions: Aspirin Instructions: ASA 81, no instructions, will ask nephrologist Last Dose:  Activity level: Can go up a flight of stairs and perform activities of daily living without stopping and without symptoms of chest pain or shortness of breath.    Anesthesia review: HTN, DM, murmur, CAD, angina pectoris, nephrotic syndrome   Patient denies shortness of breath, fever, cough and chest pain at PAT appointment   Patient verbalized understanding of instructions that were given to them at the PAT appointment. Patient was also instructed that they will need to review over the PAT instructions again at home before surgery.

## 2021-09-10 ENCOUNTER — Other Ambulatory Visit: Payer: Self-pay

## 2021-09-10 ENCOUNTER — Encounter (HOSPITAL_COMMUNITY): Payer: Self-pay

## 2021-09-10 ENCOUNTER — Encounter (HOSPITAL_COMMUNITY)
Admission: RE | Admit: 2021-09-10 | Discharge: 2021-09-10 | Disposition: A | Discharge status: Home / Self Care | Payer: Medicare Other | Source: Ambulatory Visit | Attending: General Surgery | Admitting: General Surgery

## 2021-09-10 VITALS — BP 116/77 | HR 77 | Temp 98.2°F | Ht 66.0 in | Wt 183.8 lb

## 2021-09-10 DIAGNOSIS — Z01812 Encounter for preprocedural laboratory examination: Secondary | ICD-10-CM | POA: Insufficient documentation

## 2021-09-10 DIAGNOSIS — K8080 Other cholelithiasis without obstruction: Secondary | ICD-10-CM | POA: Insufficient documentation

## 2021-09-10 DIAGNOSIS — I10 Essential (primary) hypertension: Secondary | ICD-10-CM

## 2021-09-10 DIAGNOSIS — K8020 Calculus of gallbladder without cholecystitis without obstruction: Secondary | ICD-10-CM

## 2021-09-10 DIAGNOSIS — E1140 Type 2 diabetes mellitus with diabetic neuropathy, unspecified: Secondary | ICD-10-CM | POA: Diagnosis not present

## 2021-09-10 DIAGNOSIS — E1121 Type 2 diabetes mellitus with diabetic nephropathy: Secondary | ICD-10-CM

## 2021-09-10 HISTORY — DX: Pure hypercholesterolemia, unspecified: E78.00

## 2021-09-10 HISTORY — PX: CHOLECYSTECTOMY: SHX55

## 2021-09-10 HISTORY — DX: Angina pectoris, unspecified: I20.9

## 2021-09-10 LAB — COMPREHENSIVE METABOLIC PANEL
ALT: 21 U/L (ref 0–44)
AST: 26 U/L (ref 15–41)
Albumin: 4.3 g/dL (ref 3.5–5.0)
Alkaline Phosphatase: 63 U/L (ref 38–126)
Anion gap: 8 (ref 5–15)
BUN: 17 mg/dL (ref 8–23)
CO2: 28 mmol/L (ref 22–32)
Calcium: 9.6 mg/dL (ref 8.9–10.3)
Chloride: 100 mmol/L (ref 98–111)
Creatinine, Ser: 0.81 mg/dL (ref 0.44–1.00)
GFR, Estimated: >60 mL/min (ref >60)
Glucose, Bld: 162 mg/dL — ABNORMAL HIGH (ref 70–99)
Potassium: 3.8 mmol/L (ref 3.5–5.1)
Sodium: 136 mmol/L (ref 135–145)
Total Bilirubin: 0.7 mg/dL (ref 0.3–1.2)
Total Protein: 7.7 g/dL (ref 6.5–8.1)

## 2021-09-10 LAB — CBC
HCT: 45.7 % (ref 36.0–46.0)
Hemoglobin: 15.3 g/dL — ABNORMAL HIGH (ref 12.0–15.0)
MCH: 29.3 pg (ref 26.0–34.0)
MCHC: 33.5 g/dL (ref 30.0–36.0)
MCV: 87.5 fL (ref 80.0–100.0)
Platelets: 238 10*3/uL (ref 150–400)
RBC: 5.22 MIL/uL — ABNORMAL HIGH (ref 3.87–5.11)
RDW: 12.6 % (ref 11.5–15.5)
WBC: 10.1 10*3/uL (ref 4.0–10.5)
nRBC: 0 % (ref 0.0–0.2)

## 2021-09-10 LAB — HEMOGLOBIN A1C
Hgb A1c MFr Bld: 6.9 % — ABNORMAL HIGH (ref 4.8–5.6)
Mean Plasma Glucose: 151.33 mg/dL

## 2021-09-10 LAB — GLUCOSE, CAPILLARY: Glucose-Capillary: 170 mg/dL — ABNORMAL HIGH (ref 70–99)

## 2021-09-16 NOTE — Anesthesia Preprocedure Evaluation (Addendum)
Anesthesia Evaluation  Patient identified by MRN, date of birth, ID band Patient awake    Reviewed: Allergy & Precautions, H&P , NPO status , Patient's Chart, lab work & pertinent test results  Airway Mallampati: II  TM Distance: >3 FB Neck ROM: Full    Dental no notable dental hx.    Pulmonary neg pulmonary ROS, former smoker,    Pulmonary exam normal breath sounds clear to auscultation       Cardiovascular hypertension, Pt. on medications and Pt. on home beta blockers Normal cardiovascular exam Rhythm:Regular Rate:Normal     Neuro/Psych negative neurological ROS  negative psych ROS   GI/Hepatic Neg liver ROS, GERD  ,  Endo/Other  diabetes  Renal/GU Renal InsufficiencyRenal disease  negative genitourinary   Musculoskeletal negative musculoskeletal ROS (+)   Abdominal   Peds negative pediatric ROS (+)  Hematology negative hematology ROS (+)   Anesthesia Other Findings   Reproductive/Obstetrics negative OB ROS                            Anesthesia Physical Anesthesia Plan  ASA: 2  Anesthesia Plan: General   Post-op Pain Management:    Induction: Intravenous  PONV Risk Score and Plan: 3 and Ondansetron, Dexamethasone, Treatment may vary due to age or medical condition and Midazolam  Airway Management Planned: Oral ETT  Additional Equipment:   Intra-op Plan:   Post-operative Plan: Extubation in OR  Informed Consent: I have reviewed the patients History and Physical, chart, labs and discussed the procedure including the risks, benefits and alternatives for the proposed anesthesia with the patient or authorized representative who has indicated his/her understanding and acceptance.     Dental advisory given  Plan Discussed with: CRNA and Surgeon  Anesthesia Plan Comments:         Anesthesia Quick Evaluation

## 2021-09-17 ENCOUNTER — Ambulatory Visit (HOSPITAL_COMMUNITY): Payer: Medicare Other | Admitting: Anesthesiology

## 2021-09-17 ENCOUNTER — Encounter (HOSPITAL_COMMUNITY): Payer: Self-pay | Admitting: General Surgery

## 2021-09-17 ENCOUNTER — Encounter (HOSPITAL_COMMUNITY)
Admission: RE | Disposition: A | Discharge status: Home / Self Care | Payer: Self-pay | Source: Ambulatory Visit | Attending: General Surgery

## 2021-09-17 ENCOUNTER — Ambulatory Visit (HOSPITAL_COMMUNITY)
Admission: RE | Admit: 2021-09-17 | Discharge: 2021-09-17 | Disposition: A | Discharge status: Home / Self Care | Payer: Medicare Other | Source: Ambulatory Visit | Attending: General Surgery | Admitting: General Surgery

## 2021-09-17 ENCOUNTER — Ambulatory Visit (HOSPITAL_COMMUNITY): Payer: Medicare Other | Admitting: Physician Assistant

## 2021-09-17 DIAGNOSIS — K219 Gastro-esophageal reflux disease without esophagitis: Secondary | ICD-10-CM | POA: Diagnosis not present

## 2021-09-17 DIAGNOSIS — I10 Essential (primary) hypertension: Secondary | ICD-10-CM | POA: Diagnosis not present

## 2021-09-17 DIAGNOSIS — Z87891 Personal history of nicotine dependence: Secondary | ICD-10-CM | POA: Insufficient documentation

## 2021-09-17 DIAGNOSIS — Z79899 Other long term (current) drug therapy: Secondary | ICD-10-CM | POA: Insufficient documentation

## 2021-09-17 DIAGNOSIS — E119 Type 2 diabetes mellitus without complications: Secondary | ICD-10-CM | POA: Insufficient documentation

## 2021-09-17 DIAGNOSIS — R599 Enlarged lymph nodes, unspecified: Secondary | ICD-10-CM | POA: Insufficient documentation

## 2021-09-17 DIAGNOSIS — K8010 Calculus of gallbladder with chronic cholecystitis without obstruction: Secondary | ICD-10-CM | POA: Diagnosis not present

## 2021-09-17 HISTORY — PX: CHOLECYSTECTOMY: SHX55

## 2021-09-17 LAB — GLUCOSE, CAPILLARY
Glucose-Capillary: 147 mg/dL — ABNORMAL HIGH (ref 70–99)
Glucose-Capillary: 158 mg/dL — ABNORMAL HIGH (ref 70–99)

## 2021-09-17 SURGERY — LAPAROSCOPIC CHOLECYSTECTOMY WITH INTRAOPERATIVE CHOLANGIOGRAM
Anesthesia: General

## 2021-09-17 MED ORDER — BUPIVACAINE-EPINEPHRINE 0.25% -1:200000 IJ SOLN
INTRAMUSCULAR | Status: DC | PRN
  Administered 2021-09-17: 24 mL

## 2021-09-17 MED ORDER — CHLORHEXIDINE GLUCONATE CLOTH 2 % EX PADS: 6.0000 | MEDICATED_PAD | Freq: Once | CUTANEOUS | Status: DC

## 2021-09-17 MED ORDER — PHENYLEPHRINE 40 MCG/ML (10ML) SYRINGE FOR IV PUSH (FOR BLOOD PRESSURE SUPPORT)
PREFILLED_SYRINGE | INTRAVENOUS | Status: DC | PRN
  Administered 2021-09-17: 120 ug via INTRAVENOUS

## 2021-09-17 MED ORDER — LACTATED RINGERS IV SOLN: INTRAVENOUS | Status: DC

## 2021-09-17 MED ORDER — CHLORHEXIDINE GLUCONATE 0.12 % MT SOLN
15.0000 mL | Freq: Once | OROMUCOSAL | Status: AC
  Administered 2021-09-17: 15 mL via OROMUCOSAL

## 2021-09-17 MED ORDER — PHENYLEPHRINE HCL (PRESSORS) 10 MG/ML IV SOLN
INTRAVENOUS | Status: AC
  Filled 2021-09-17: qty 1

## 2021-09-17 MED ORDER — CEFAZOLIN SODIUM-DEXTROSE 2-4 GM/100ML-% IV SOLN
2.0000 g | INTRAVENOUS | Status: AC
  Administered 2021-09-17: 2 g via INTRAVENOUS
  Filled 2021-09-17: qty 100

## 2021-09-17 MED ORDER — ACETAMINOPHEN 500 MG PO TABS
1000.0000 mg | ORAL_TABLET | ORAL | Status: DC
  Filled 2021-09-17: qty 2

## 2021-09-17 MED ORDER — OXYCODONE HCL 5 MG PO TABS: 5.0000 mg | ORAL_TABLET | Freq: Once | ORAL | Status: DC | PRN

## 2021-09-17 MED ORDER — ORAL CARE MOUTH RINSE: 15.0000 mL | Freq: Once | OROMUCOSAL | Status: AC

## 2021-09-17 MED ORDER — LIDOCAINE 2% (20 MG/ML) 5 ML SYRINGE
INTRAMUSCULAR | Status: DC | PRN
  Administered 2021-09-17: 100 mg via INTRAVENOUS

## 2021-09-17 MED ORDER — ONDANSETRON HCL 4 MG/2ML IJ SOLN
INTRAMUSCULAR | Status: DC | PRN
  Administered 2021-09-17: 4 mg via INTRAVENOUS

## 2021-09-17 MED ORDER — BUPIVACAINE-EPINEPHRINE (PF) 0.25% -1:200000 IJ SOLN
INTRAMUSCULAR | Status: AC
  Filled 2021-09-17: qty 30

## 2021-09-17 MED ORDER — 0.9 % SODIUM CHLORIDE (POUR BTL) OPTIME
TOPICAL | Status: DC | PRN
  Administered 2021-09-17: 1000 mL

## 2021-09-17 MED ORDER — ROCURONIUM BROMIDE 10 MG/ML (PF) SYRINGE
PREFILLED_SYRINGE | INTRAVENOUS | Status: DC | PRN
Start: 2021-09-17 — End: 2021-09-17
  Administered 2021-09-17: 70 mg via INTRAVENOUS

## 2021-09-17 MED ORDER — OXYCODONE HCL 5 MG/5ML PO SOLN: 5.0000 mg | Freq: Once | ORAL | Status: DC | PRN

## 2021-09-17 MED ORDER — MIDAZOLAM HCL 2 MG/2ML IJ SOLN
INTRAMUSCULAR | Status: DC | PRN
Start: 2021-09-17 — End: 2021-09-17
  Administered 2021-09-17 (×2): 1 mg via INTRAVENOUS

## 2021-09-17 MED ORDER — ONDANSETRON HCL 4 MG/2ML IJ SOLN: 4.0000 mg | Freq: Once | INTRAMUSCULAR | Status: DC | PRN

## 2021-09-17 MED ORDER — PHENYLEPHRINE HCL-NACL 20-0.9 MG/250ML-% IV SOLN
INTRAVENOUS | Status: DC | PRN
  Administered 2021-09-17: 55 ug/min via INTRAVENOUS

## 2021-09-17 MED ORDER — DEXAMETHASONE SODIUM PHOSPHATE 10 MG/ML IJ SOLN
INTRAMUSCULAR | Status: DC | PRN
  Administered 2021-09-17: 10 mg via INTRAVENOUS

## 2021-09-17 MED ORDER — ALBUTEROL SULFATE (2.5 MG/3ML) 0.083% IN NEBU
INHALATION_SOLUTION | RESPIRATORY_TRACT | Status: AC
  Filled 2021-09-17: qty 3

## 2021-09-17 MED ORDER — PROPOFOL 10 MG/ML IV BOLUS
INTRAVENOUS | Status: DC | PRN
  Administered 2021-09-17: 140 mg via INTRAVENOUS

## 2021-09-17 MED ORDER — ACETAMINOPHEN 10 MG/ML IV SOLN: 1000.0000 mg | Freq: Once | INTRAVENOUS | Status: DC | PRN

## 2021-09-17 MED ORDER — FENTANYL CITRATE (PF) 250 MCG/5ML IJ SOLN
INTRAMUSCULAR | Status: AC
  Filled 2021-09-17: qty 5

## 2021-09-17 MED ORDER — SUGAMMADEX SODIUM 500 MG/5ML IV SOLN
INTRAVENOUS | Status: DC | PRN
  Administered 2021-09-17: 400 mg via INTRAVENOUS

## 2021-09-17 MED ORDER — PROPOFOL 10 MG/ML IV BOLUS
INTRAVENOUS | Status: AC
  Filled 2021-09-17: qty 20

## 2021-09-17 MED ORDER — EPHEDRINE SULFATE-NACL 50-0.9 MG/10ML-% IV SOSY
PREFILLED_SYRINGE | INTRAVENOUS | Status: DC | PRN
  Administered 2021-09-17: 15 mg via INTRAVENOUS

## 2021-09-17 MED ORDER — GABAPENTIN 300 MG PO CAPS
300.0000 mg | ORAL_CAPSULE | ORAL | Status: AC
  Administered 2021-09-17: 300 mg via ORAL
  Filled 2021-09-17: qty 1

## 2021-09-17 MED ORDER — CELECOXIB 200 MG PO CAPS
200.0000 mg | ORAL_CAPSULE | ORAL | Status: DC
  Filled 2021-09-17: qty 1

## 2021-09-17 MED ORDER — FENTANYL CITRATE (PF) 250 MCG/5ML IJ SOLN
INTRAMUSCULAR | Status: DC | PRN
  Administered 2021-09-17 (×5): 50 ug via INTRAVENOUS

## 2021-09-17 MED ORDER — MIDAZOLAM HCL 2 MG/2ML IJ SOLN
INTRAMUSCULAR | Status: AC
  Filled 2021-09-17: qty 2

## 2021-09-17 MED ORDER — HYDROMORPHONE HCL 1 MG/ML IJ SOLN: 0.2500 mg | INTRAMUSCULAR | Status: DC | PRN

## 2021-09-17 SURGICAL SUPPLY — 31 items
APPLIER CLIP 5 13 M/L LIGAMAX5 (MISCELLANEOUS) ×2
BAG COUNTER SPONGE SURGICOUNT (BAG) IMPLANT
CABLE HIGH FREQUENCY MONO STRZ (ELECTRODE) ×2 IMPLANT
CATH REDDICK CHOLANGI 4FR 50CM (CATHETERS) ×2 IMPLANT
CHLORAPREP W/TINT 26 (MISCELLANEOUS) ×2 IMPLANT
CLIP APPLIE 5 13 M/L LIGAMAX5 (MISCELLANEOUS) ×1 IMPLANT
COVER MAYO STAND STRL (DRAPES) ×2 IMPLANT
DECANTER SPIKE VIAL GLASS SM (MISCELLANEOUS) ×2 IMPLANT
DERMABOND ADVANCED (GAUZE/BANDAGES/DRESSINGS) ×1
DERMABOND ADVANCED .7 DNX12 (GAUZE/BANDAGES/DRESSINGS) ×1 IMPLANT
DRAPE C-ARM 42X120 X-RAY (DRAPES) ×2 IMPLANT
ELECT REM PT RETURN 15FT ADLT (MISCELLANEOUS) ×2 IMPLANT
GLOVE SURG ENC MOIS LTX SZ7.5 (GLOVE) ×2 IMPLANT
GOWN STRL REUS W/TWL XL LVL3 (GOWN DISPOSABLE) ×6 IMPLANT
HEMOSTAT SURGICEL 4X8 (HEMOSTASIS) IMPLANT
IRRIG SUCT STRYKERFLOW 2 WTIP (MISCELLANEOUS) ×2
IRRIGATION SUCT STRKRFLW 2 WTP (MISCELLANEOUS) ×1 IMPLANT
IV CATH 14GX2 1/4 (CATHETERS) ×2 IMPLANT
KIT BASIN OR (CUSTOM PROCEDURE TRAY) ×2 IMPLANT
KIT TURNOVER KIT A (KITS) IMPLANT
PENCIL SMOKE EVACUATOR (MISCELLANEOUS) IMPLANT
POUCH SPECIMEN RETRIEVAL 10MM (ENDOMECHANICALS) ×2 IMPLANT
SCISSORS LAP 5X35 DISP (ENDOMECHANICALS) ×2 IMPLANT
SET TUBE SMOKE EVAC HIGH FLOW (TUBING) ×2 IMPLANT
SLEEVE XCEL OPT CAN 5 100 (ENDOMECHANICALS) ×4 IMPLANT
SUT MNCRL AB 4-0 PS2 18 (SUTURE) ×2 IMPLANT
TOWEL OR 17X26 10 PK STRL BLUE (TOWEL DISPOSABLE) ×2 IMPLANT
TOWEL OR NON WOVEN STRL DISP B (DISPOSABLE) ×2 IMPLANT
TRAY LAPAROSCOPIC (CUSTOM PROCEDURE TRAY) ×2 IMPLANT
TROCAR BLADELESS OPT 5 100 (ENDOMECHANICALS) ×2 IMPLANT
TROCAR XCEL BLUNT TIP 100MML (ENDOMECHANICALS) ×2 IMPLANT

## 2021-09-17 NOTE — Transfer of Care (Signed)
Immediate Anesthesia Transfer of Care Note  Patient: Mallory Lewis [facility]  Procedure(s) Performed: LAPAROSCOPIC CHOLECYSTECTOMY  Patient Location: PACU  Anesthesia Type:General  Level of Consciousness: sedated  Airway & Oxygen Therapy: Patient Spontanous Breathing and Patient connected to face mask oxygen  Post-op Assessment: Report given to RN and Post -op Vital signs reviewed and stable  Post vital signs: Reviewed and stable  Last Vitals:  Vitals Value Taken Time  BP 147/88 09/17/21 0950  Temp    Pulse 73 09/17/21 0954  Resp 10 09/17/21 0954  SpO2 99 % 09/17/21 0954  Vitals shown include unvalidated device data.  Last Pain:  Vitals:   09/17/21 0548  TempSrc: Oral         Complications: No notable events documented.

## 2021-09-17 NOTE — Anesthesia Procedure Notes (Signed)
Procedure Name: Intubation Date/Time: 09/17/2021 8:32 AM Performed by: Cynda Familia, CRNA Pre-anesthesia Checklist: Patient identified, Emergency Drugs available, Suction available and Patient being monitored Patient Re-evaluated:Patient Re-evaluated prior to induction Oxygen Delivery Method: Circle System Utilized Preoxygenation: Pre-oxygenation with 100% oxygen Induction Type: IV induction Ventilation: Mask ventilation without difficulty Grade View: Grade I Tube type: Oral Number of attempts: 1 Airway Equipment and Method: Stylet Placement Confirmation: ETT inserted through vocal cords under direct vision, positive ETCO2 and breath sounds checked- equal and bilateral Secured at: 22 cm Tube secured with: Tape Dental Injury: Teeth and Oropharynx as per pre-operative assessment  Comments: Smooth IV induction Rose- intubation atraumatic- teeth and mouth as preop- slight chipping present on front teeth prior to intubation- bilat BS Rose

## 2021-09-17 NOTE — Interval H&P Note (Signed)
History and Physical Interval Note:  09/17/2021 8:13 AM  Mallory Lewis  has presented today for surgery, with the diagnosis of GALLSTONES.  The various methods of treatment have been discussed with the patient and family. After consideration of risks, benefits and other options for treatment, the patient has consented to  Procedure(s): LAPAROSCOPIC CHOLECYSTECTOMY WITH INTRAOPERATIVE CHOLANGIOGRAM (N/A) as a surgical intervention.  The patient's history has been reviewed, patient examined, no change in status, stable for surgery.  I have reviewed the patient's chart and labs.  Questions were answered to the patient's satisfaction.     Autumn Messing III

## 2021-09-17 NOTE — H&P (Signed)
REFERRING PHYSICIAN: Oren Beckmann, MD  PROVIDER: Landry Corporal, MD  MRN: [id number] DOB: Apr 03, 1956 Subjective  Chief Complaint: Cholelithiasis   History of Present Illness: Mallory Lewis is a 65 y.o. female who is seen today as an office consultation at the request of Dr. Sheryn Bison for evaluation of Cholelithiasis .   We are asked to see the patient in consultation by Dr. Aura Dials to evaluate her for gallstones. The patient is a 65 year old white female who has been experiencing some intermittent bloating and an occasional episode of nausea and vomiting. She had a recent CT scan of the chest to follow some pulmonary nodules which have been stable and felt to be benign. The CT scan also showed a moderate sized stone in the gallbladder but no gallbladder wall thickening or ductal dilatation.  Review of Systems: A complete review of systems was obtained from the patient. I have reviewed this information and discussed as appropriate with the patient. See HPI as well for other ROS.  ROS   Medical History: Past Medical History:  Diagnosis Date   Acne   Basal cell carcinoma   GERD (gastroesophageal reflux disease)   Hypertension   Osteopenia   Renal insufficiency   Type 2 diabetes mellitus (CMS-HCC)   Patient Active Problem List  Diagnosis   Osteopenia   Degeneration of lumbar or lumbosacral intervertebral disc   Enthesopathy of hip region   Dermatitis due to solar radiation   History of skin cancer   Diffuse photodamage of skin   Rosacea   Calculus of gallbladder without cholecystitis without obstruction   Past Surgical History:  Procedure Laterality Date   APPENDECTOMY   Basal Cell Skin Cancer with Mohs Procedure 2009   [facility]   COLPOPEXY 2004  and repair of rectocele   HYSTERECTOMY 1990   SKIN BIOPSY 11/14/2006  Chin: B[facility].Excised by Dr. Lacinda Axon.    Allergies  Allergen Reactions   Iodinated Contrast Media Rash and Hives   Current  Outpatient Medications on File Prior to Visit  Medication Sig Dispense Refill   *sennosides oral   acetaminophen (TYLENOL) 500 MG tablet Take 500 mg by mouth every 6 (six) hours as needed.   aspirin 81 MG EC tablet Take 81 mg by mouth once daily   carvediloL (COREG) 3.125 MG tablet Take by mouth   cyclobenzaprine (FLEXERIL) 10 MG tablet Take 10 mg by mouth 3 (three) times daily as needed   dapagliflozin (FARXIGA) 5 mg Tab tablet Take by mouth once daily   doxycycline (VIBRAMYCIN) 50 MG capsule Take 1 capsule (50 mg total) by mouth daily. (Patient not taking: Reported on 06/09/2021) 30 capsule 12   ezetimibe (ZETIA) 10 mg tablet Take by mouth   ferrous sulfate 142 mg (45 mg iron) TbER Take by mouth   ibuprofen (ADVIL,MOTRIN) 200 MG tablet Take 200 mg by mouth every 6 (six) hours as needed for Pain. (Patient not taking: Reported on 06/09/2021)   isosorbide mononitrate (IMDUR) 30 MG ER tablet Take 30 mg by mouth once daily   losartan (COZAAR) 100 MG tablet Take by mouth   MICROLET LANCET as directed   omeprazole (PRILOSEC) 40 MG DR capsule TAKE ONE CAPSULE BY MOUTH EVERY DAY (Patient not taking: Reported on 06/09/2021) 90 capsule 3   potassium chloride (KLOR-CON) 20 MEQ ER tablet Take by mouth   rosuvastatin (CRESTOR) 10 MG tablet Take 1 tablet by mouth once daily   tacrolimus (PROGRAF) 1 MG capsule Take 1 mg by mouth 2 (  two) times daily   TORsemide (DEMADEX) 20 MG tablet Take by mouth   No current facility-administered medications on file prior to visit.   Family History  Problem Relation Age of Onset   Hypothyroidism Mother   Anemia Mother   High blood pressure (Hypertension) Mother   Reflux disease Mother   Ulcers Mother   Diabetes type II Father   Coronary Artery Disease (Blocked arteries around heart) Father   Skin cancer Father   Heart disease Father   High blood pressure (Hypertension) Father   Osteoarthritis Father   Prostate cancer Father   Hyperlipidemia (Elevated  cholesterol) Sister    Social History   Tobacco Use  Smoking Status Passive Smoke Exposure - Never Smoker  Smokeless Tobacco Never    Social History   Socioeconomic History   Marital status: Married  Tobacco Use   Smoking status: Passive Smoke Exposure - Never Smoker   Smokeless tobacco: Never   Objective:   Vitals:  Pulse: 93  Temp: (!) 35.8 C (96.4 F)  SpO2: 95%  Weight: 81.2 kg (179 lb)  Height: 167.6 cm (5\' 6" )   Body mass index is 28.89 kg/m.  Physical Exam Vitals reviewed.  Constitutional:  General: She is not in acute distress. Appearance: Normal appearance.  HENT:  Head: Normocephalic and atraumatic.  Right Ear: External ear normal.  Left Ear: External ear normal.  Nose: Nose normal.  Mouth/Throat:  Mouth: Mucous membranes are moist.  Pharynx: Oropharynx is clear.  Eyes:  General: No scleral icterus. Extraocular Movements: Extraocular movements intact.  Conjunctiva/sclera: Conjunctivae normal.  Pupils: Pupils are equal, round, and reactive to light.  Cardiovascular:  Rate and Rhythm: Normal rate and regular rhythm.  Pulses: Normal pulses.  Heart sounds: Normal heart sounds.  Pulmonary:  Effort: Pulmonary effort is normal. No respiratory distress.  Breath sounds: Normal breath sounds.  Abdominal:  General: Bowel sounds are normal.  Palpations: Abdomen is soft. There is no mass.  Tenderness: There is no abdominal tenderness.  Musculoskeletal:  General: No swelling, tenderness or deformity. Normal range of motion.  Cervical back: Normal range of motion and neck supple.  Skin: General: Skin is warm and dry.  Coloration: Skin is not jaundiced.  Neurological:  General: No focal deficit present.  Mental Status: She is alert and oriented to person, place, and time.  Psychiatric:  Mood and Affect: Mood normal.  Behavior: Behavior normal.     Labs, Imaging and Diagnostic Testing:  Assessment and Plan:  Diagnoses and all orders for this  visit:  Calculus of gallbladder without cholecystitis without obstruction    The patient appears to have symptomatic gallstones. Because of the risk of further painful episodes and possible pancreatitis I feel she would probably benefit from having her gallbladder removed. She would also like to have this done. I have discussed with her in detail the risks and benefits of the operation to remove the gallbladder as well as some of the technical aspects and she understands and wishes to proceed. I would plan for a laparoscopic cholecystectomy with intraoperative cholangiogram.

## 2021-09-17 NOTE — Anesthesia Postprocedure Evaluation (Signed)
Anesthesia Post Note  Patient: Mallory Lewis [facility]  Procedure(s) Performed: LAPAROSCOPIC CHOLECYSTECTOMY     Patient location during evaluation: PACU Anesthesia Type: General Level of consciousness: awake and alert Pain management: pain level controlled Vital Signs Assessment: post-procedure vital signs reviewed and stable Respiratory status: spontaneous breathing, nonlabored ventilation, respiratory function stable and patient connected to nasal cannula oxygen Cardiovascular status: blood pressure returned to baseline and stable Postop Assessment: no apparent nausea or vomiting Anesthetic complications: no   No notable events documented.  Last Vitals:  Vitals:   09/17/21 1015 09/17/21 1030  BP: (!) 132/91 (!) 140/91  Pulse: 74 79  Resp:    Temp:    SpO2: 97% 93%    Last Pain:  Vitals:   09/17/21 0950  TempSrc:   PainSc: 0-No pain                 Lucile Hillmann S

## 2021-09-17 NOTE — Anesthesia Procedure Notes (Signed)
Date/Time: 09/17/2021 9:42 AM Performed by: Cynda Familia, CRNA Oxygen Delivery Method: Simple face mask Placement Confirmation: positive ETCO2 and breath sounds checked- equal and bilateral Dental Injury: Teeth and Oropharynx as per pre-operative assessment

## 2021-09-17 NOTE — Op Note (Signed)
09/17/2021  9:18 AM  PATIENT:  Mallory Lewis  65 y.o. female  PRE-OPERATIVE DIAGNOSIS:  GALLSTONES  POST-OPERATIVE DIAGNOSIS:  GALLSTONES  PROCEDURE:  Procedure(s): LAPAROSCOPIC CHOLECYSTECTOMY (N/A)  SURGEON:  Surgeon(s) and Role:    * Jovita Kussmaul, MD - Primary  PHYSICIAN ASSISTANT:   ASSISTANTS: Judyann Munson, RNFA   ANESTHESIA:   local and general  EBL:  20 mL   BLOOD ADMINISTERED:none  DRAINS: none   LOCAL MEDICATIONS USED:  MARCAINE     SPECIMEN:  Source of Specimen:  gallbladder  DISPOSITION OF SPECIMEN:  PATHOLOGY  COUNTS:  YES  TOURNIQUET:  * No tourniquets in log *  DICTATION: .Dragon Dictation    Procedure: After informed consent was obtained the patient was brought to the operating room and placed in the supine position on the operating room table. After adequate induction of general anesthesia the patient's abdomen was prepped with ChloraPrep allowed to dry and draped in usual sterile manner. An appropriate timeout was performed. The area below the umbilicus was infiltrated with quarter percent  Marcaine. A small incision was made with a 15 blade knife. The incision was carried down through the subcutaneous tissue bluntly with a hemostat and [employer] retractors. The linea alba was identified. The linea alba was incised with a 15 blade knife and each side was grasped with Coker clamps. The preperitoneal space was then probed with a hemostat until the peritoneum was opened and access was gained to the abdominal cavity. A 0 Vicryl pursestring stitch was placed in the fascia surrounding the opening. A Hassan cannula was then placed through the opening and anchored in place with the previously placed Vicryl purse string stitch. The abdomen was insufflated with carbon dioxide without difficulty. A laparoscope was inserted through the [facility] cannula in the right upper quadrant was inspected. Next the epigastric region was infiltrated with % Marcaine. A  small incision was made with a 15 blade knife. A 5 mm port was placed bluntly through this incision into the abdominal cavity under direct vision. Next 2 sites were chosen laterally on the right side of the abdomen for placement of 5 mm ports. Each of these areas was infiltrated with quarter percent Marcaine. Small stab incisions were made with a 15 blade knife. 5 mm ports were then placed bluntly through these incisions into the abdominal cavity under direct vision without difficulty. A blunt grasper was placed through the lateralmost 5 mm port and used to grasp the dome of the gallbladder and elevated anteriorly and superiorly. Another blunt grasper was placed through the other 5 mm port and used to retract the body and neck of the gallbladder. A dissector was placed through the epigastric port and using the electrocautery the peritoneal reflection at the gallbladder neck was opened. Blunt dissection was then carried out in this area until the gallbladder neck-cystic duct junction was readily identified and a good window was created. A single clip was placed on the gallbladder neck. A small  ductotomy was made just below the clip with laparoscopic scissors. A 14-gauge Angiocath was then placed through the anterior abdominal wall under direct vision. A Reddick cholangiogram catheter was then placed through the Angiocath and flushed. The catheter was then placed in the cystic duct and anchored in place with a clip. A cholangiogram was obtained that showed no filling defects good emptying into the duodenum an adequate length on the cystic duct. The anchoring clip and catheters were then removed from the patient. 3 clips were placed  proximally on the cystic duct and the duct was divided between the 2 sets of clips. Posterior to this the cystic artery was identified and again dissected bluntly in a circumferential manner until a good window  was created. 2 clips were placed proximally and one distally on the artery and  the artery was divided between the 2 sets of clips. Next a laparoscopic hook cautery device was used to separate the gallbladder from the liver bed. Prior to completely detaching the gallbladder from the liver bed the liver bed was inspected and several small bleeding points were coagulated with the electrocautery until the area was completely hemostatic. The gallbladder was then detached the rest of it from the liver bed without difficulty. A laparoscopic bag was inserted through the hassan port. The laparoscope was moved to the epigastric port. The gallbladder was placed within the bag and the bag was sealed.  The bag with the gallbladder was then removed with the [facility] cannula through the infraumbilical port without difficulty. The fascial defect was then closed with the previously placed Vicryl pursestring stitch as well as with another figure-of-eight 0 Vicryl stitch. The liver bed was inspected again and found to be hemostatic. The abdomen was irrigated with copious amounts of saline until the effluent was clear. The ports were then removed under direct vision without difficulty and were found to be hemostatic. The gas was allowed to escape. The skin incisions were all closed with interrupted 4-0 Monocryl subcuticular stitches. Dermabond dressings were applied. The patient tolerated the procedure well. At the end of the case all needle sponge and instrument counts were correct. The patient was then awakened and taken to recovery in stable condition   PLAN OF CARE: Discharge to home after PACU  PATIENT DISPOSITION:  PACU - hemodynamically stable.   Delay start of Pharmacological VTE agent (>24hrs) due to surgical blood loss or risk of bleeding: not applicable

## 2021-09-18 ENCOUNTER — Encounter (HOSPITAL_COMMUNITY): Payer: Self-pay | Admitting: General Surgery

## 2021-09-18 LAB — SURGICAL PATHOLOGY

## 2021-11-09 ENCOUNTER — Encounter: Payer: Self-pay | Admitting: Cardiology

## 2021-11-09 ENCOUNTER — Other Ambulatory Visit: Payer: Self-pay

## 2021-11-09 DIAGNOSIS — I209 Angina pectoris, unspecified: Secondary | ICD-10-CM

## 2021-11-09 MED ORDER — ISOSORBIDE MONONITRATE ER 30 MG PO TB24: 30.0000 mg | ORAL_TABLET | Freq: Every day | ORAL | 1 refills | Status: DC

## 2021-11-09 MED ORDER — EZETIMIBE 10 MG PO TABS: 10.0000 mg | ORAL_TABLET | Freq: Every day | ORAL | 1 refills | Status: DC

## 2021-12-01 IMAGING — CT CT CARDIAC CORONARY ARTERY CALCIUM SCORE
3 series · 14 of 20 positions shown, 16 images · non-contrast
Comparison: None.

CLINICAL DATA: 54-year-old female hyperlipidemia.  Caucasian.

EXAM:
CT CARDIAC CORONARY ARTERY CALCIUM SCORE
TECHNIQUE: Non-contrast imaging through the heart was performed using
prospective ECG gating. Image post processing was performed on an
independent workstation, allowing for quantitative analysis of the
heart and coronary arteries. Note that this exam targets the heart
and the chest was not imaged in its entirety.

[Series 2: calcium scoring 2.00 qr36 bestdiast 69% hrt calciu · axial · 0.37mm/px · z∈[+1687,+1771]mm · 4 of 70 slices shown]
[im 14/70  vessel]
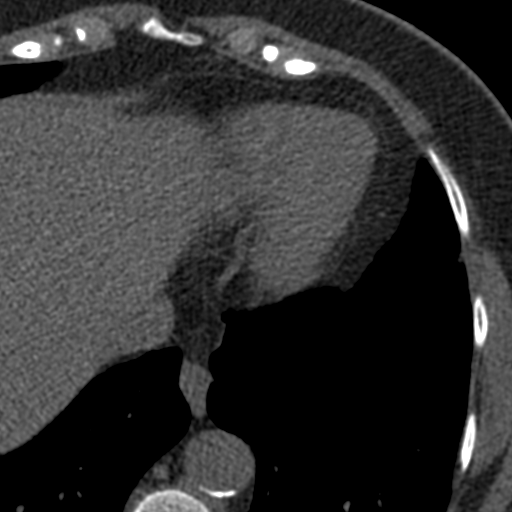
[im 28/70  vessel]
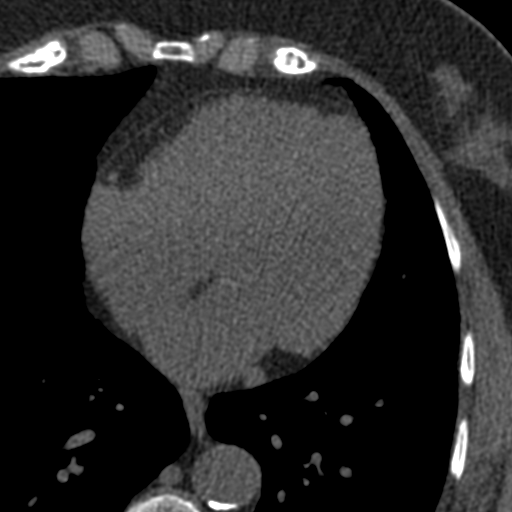
[im 42/70  vessel]
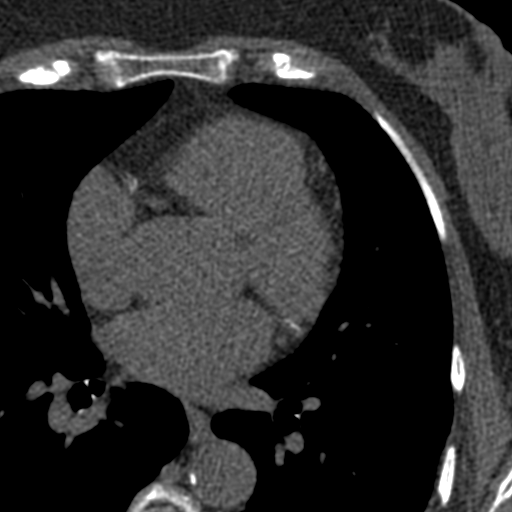
[im 56/70  vessel]
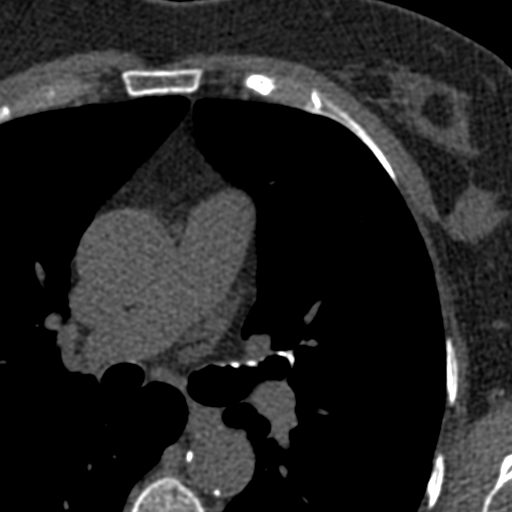

[Series 3: calcium scoring 2.00 br40 bestdiast 69% axial · axial · 0.62mm/px · z∈[+1683,+1775]mm · 5 of 70 slices shown, 7 images]
[im 12/70  vessel]
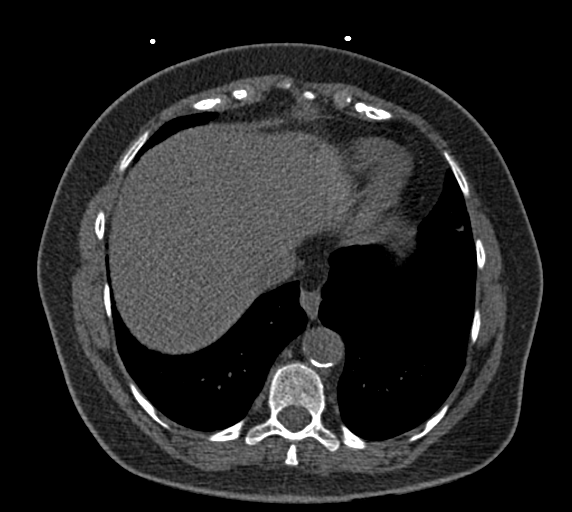
[im 12/70  lung]
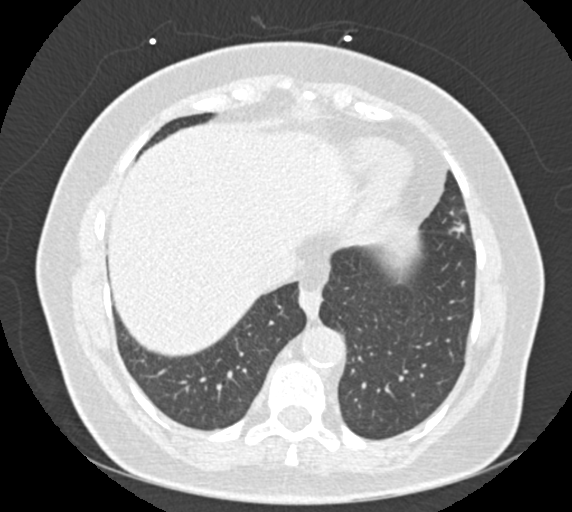
[im 24/70  vessel]
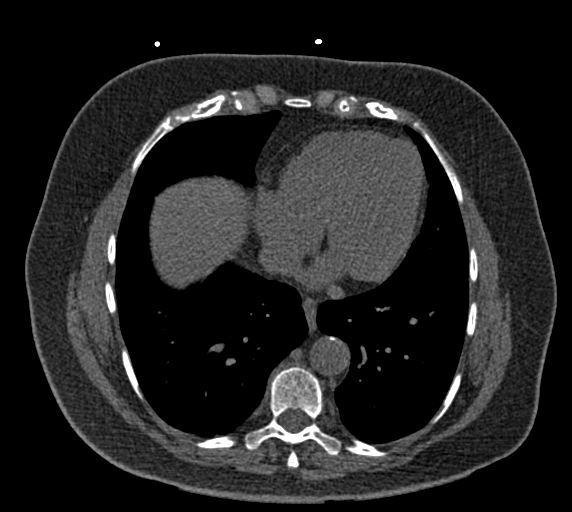
[im 35/70  vessel]
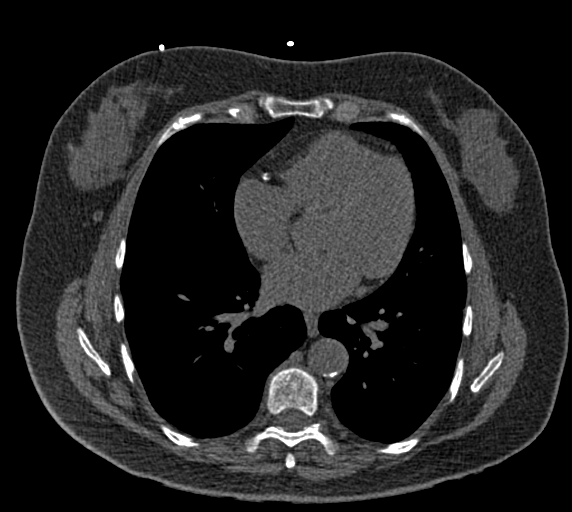
[im 47/70  vessel]
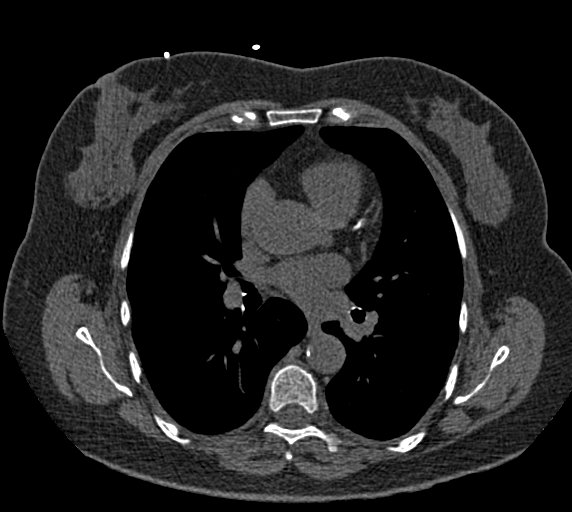
[im 58/70  vessel]
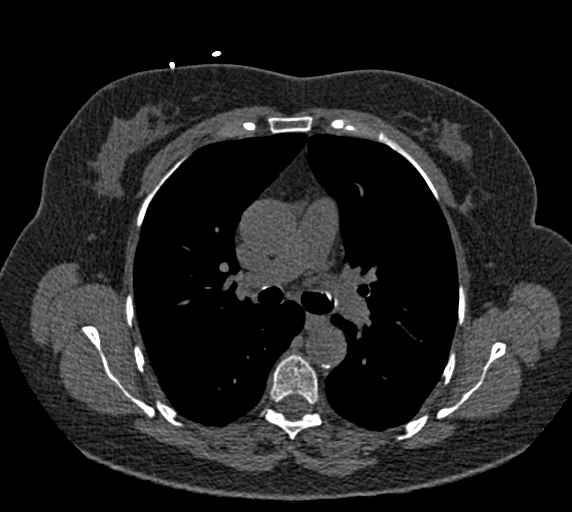
[im 58/70  lung]
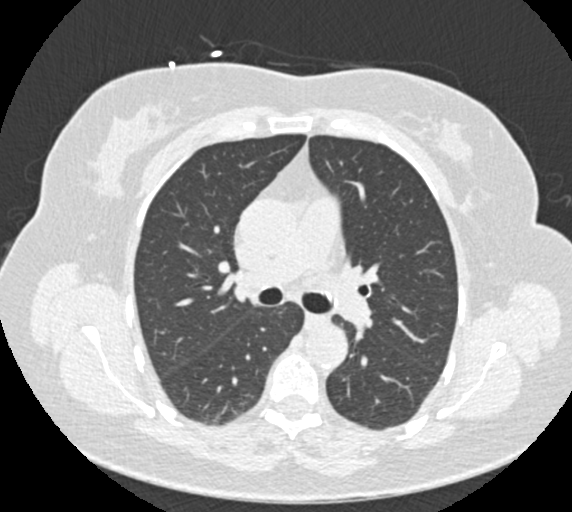

[Series 9: calcium scoring 2.00 br60 bestdiast 69% lungs · axial · 0.58mm/px · z∈[+1683,+1775]mm · 5 of 70 slices shown]
[im 12/70  vessel]
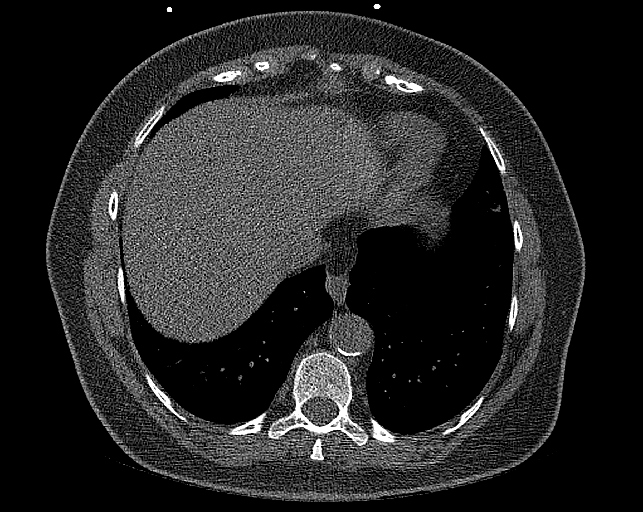
[im 24/70  vessel]
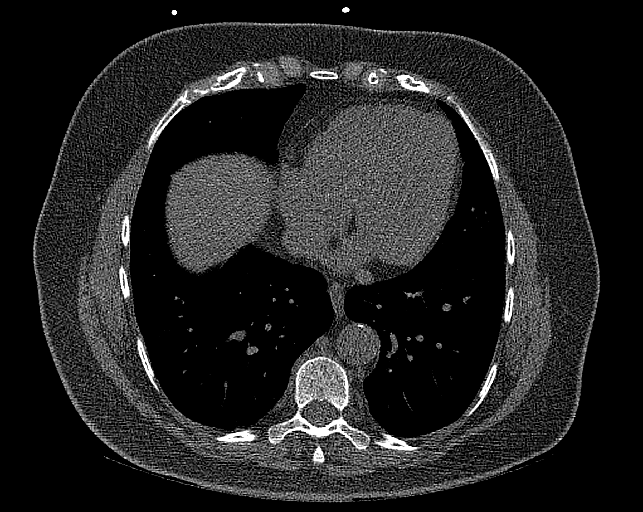
[im 35/70  vessel]
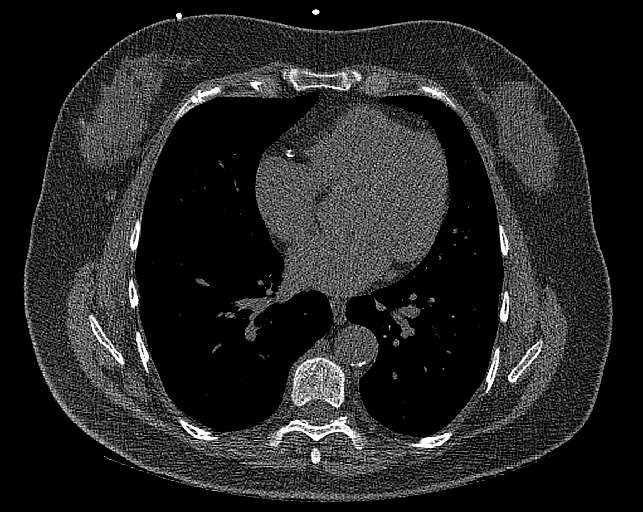
[im 47/70  vessel]
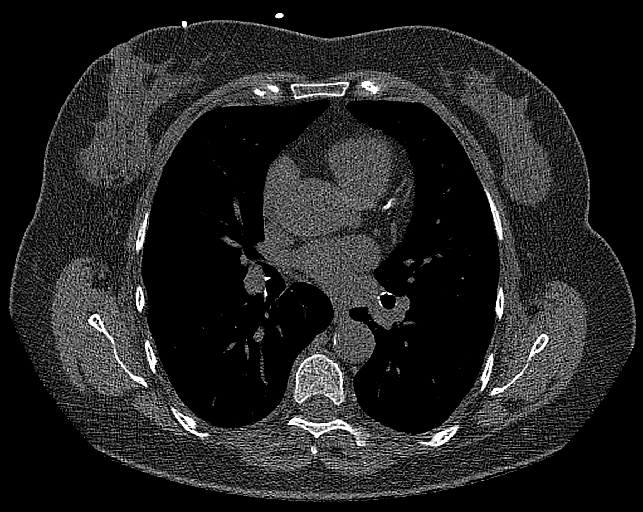
[im 58/70  vessel]
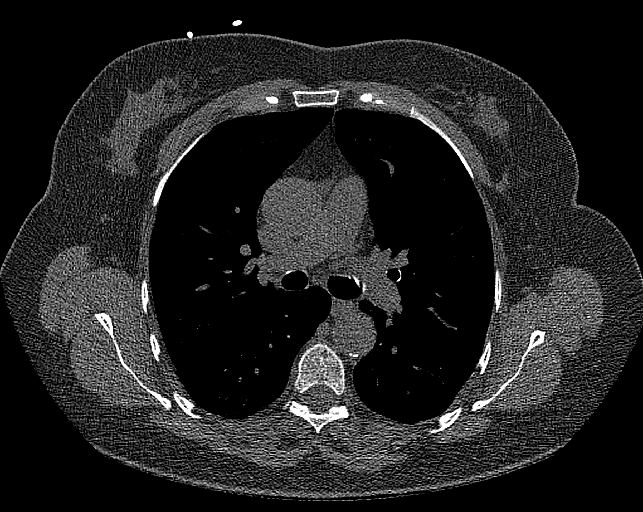

[14 of 20 positions shown; findings below may reference images not displayed]

FINDINGS: Technical quality: Good

CORONARY CALCIUM SCORES:

Left Main: No coronary artery calcification

LAD: 252

LCx:

RCA: 125

CORONARY CALCIUM

Total Agatston Score: 423

[HOSPITAL] percentile: 3

Ascending aorta (normal <  40 mm): 33 mm

EXTRACARDIAC FINDINGS:

Limited view of the lung parenchyma demonstrates 2 mm subpleural
nodule in the RIGHT lower lobe (image 37/series 3). Airways are
normal.

Limited view of the mediastinum demonstrates no adenopathy.
Esophagus normal.

Limited view of the upper abdomen unremarkable.

Limited view of the skeleton and chest wall is unremarkable.
IMPRESSION: 1. Three-vessel coronary artery calcification.

2. Total Agatston Score: 423

3. MESA age and sex matched database percentile: 95

4. Small RIGHT lobe pulmonary nodule. No follow-up needed if patient
is low-risk. Non-contrast chest CT can be considered in 12 months if
patient is high-risk. This recommendation follows the consensus
statement: Guidelines for Management of Incidental Pulmonary Nodules
Detected on CT Images: From the [HOSPITAL] 9180; Radiology

## 2022-04-09 ENCOUNTER — Other Ambulatory Visit (HOSPITAL_COMMUNITY): Payer: Self-pay

## 2022-04-09 DIAGNOSIS — R059 Cough, unspecified: Secondary | ICD-10-CM

## 2022-04-09 DIAGNOSIS — R1310 Dysphagia, unspecified: Secondary | ICD-10-CM

## 2022-04-20 ENCOUNTER — Ambulatory Visit (HOSPITAL_COMMUNITY)
Admission: RE | Admit: 2022-04-20 | Discharge: 2022-04-20 | Disposition: A | Discharge status: Home / Self Care | Payer: Medicare Other | Source: Ambulatory Visit | Attending: Family Medicine | Admitting: Family Medicine

## 2022-04-20 DIAGNOSIS — R1310 Dysphagia, unspecified: Secondary | ICD-10-CM

## 2022-04-20 DIAGNOSIS — T17308A Unspecified foreign body in larynx causing other injury, initial encounter: Secondary | ICD-10-CM | POA: Insufficient documentation

## 2022-04-20 DIAGNOSIS — R059 Cough, unspecified: Secondary | ICD-10-CM

## 2022-04-20 DIAGNOSIS — W19XXXA Unspecified fall, initial encounter: Secondary | ICD-10-CM | POA: Insufficient documentation

## 2022-04-20 DIAGNOSIS — K219 Gastro-esophageal reflux disease without esophagitis: Secondary | ICD-10-CM | POA: Insufficient documentation

## 2022-04-20 NOTE — Progress Notes (Signed)
Modified Barium Swallow Progress Note  Patient Details  Name: Mallory Lewis MRN: [id number] Date of Birth: Jul 31, 1956  Today's Date: 04/20/2022  Modified Barium Swallow completed.  Full report located under Chart Review in the Imaging Section.  Brief recommendations include the following:  Clinical Impression  Pt demonstrates no overt dysphagia. She always takes singles sips at a time, whether with cup or straw. She does have an instance of very trace flash penetration with sensationa nd ejection. Pt did not cough, but reported she felt she "almost choked" and needed to slow down. SLP provided viusal feedback with video explaining a breath hold strategy to increase early airway closure. Pt able to carry this over in practice. Pt is not in danger of aspiration, but to decrease coughing frequency and discomfort, this strategy may help. Pt additionally may benefit from medical management to improve night time reflux. No further SLP f/u needed. Esophageal sweep was WNL.   Swallow Evaluation Recommendations       SLP Diet Recommendations: Regular solids;Thin liquid   Liquid Administration via: Cup;Straw   Medication Administration: Whole meds with liquid   Supervision: Patient able to self feed   Compensations: Slow rate;Small sips/bites   Postural Changes: Remain semi-upright after after feeds/meals (Comment);Seated upright at 90 degrees            Tatyana Biber, Katherene Ponto 04/20/2022,12:29 PM

## 2022-04-26 ENCOUNTER — Encounter: Payer: Self-pay | Admitting: Cardiology

## 2022-04-27 ENCOUNTER — Other Ambulatory Visit: Payer: Self-pay

## 2022-04-27 MED ORDER — EZETIMIBE 10 MG PO TABS: 10.0000 mg | ORAL_TABLET | Freq: Every day | ORAL | 1 refills | Status: DC

## 2022-04-27 NOTE — Telephone Encounter (Signed)
For review only - no response needed. Results:  Please read message from patient.

## 2022-05-05 ENCOUNTER — Encounter: Payer: Self-pay | Admitting: Cardiology

## 2022-05-06 NOTE — Telephone Encounter (Signed)
From pt

## 2022-05-07 ENCOUNTER — Encounter: Payer: Self-pay | Admitting: Cardiology

## 2022-05-07 ENCOUNTER — Ambulatory Visit: Payer: Medicare Other | Admitting: Cardiology

## 2022-05-07 VITALS — BP 122/74 | HR 92 | Temp 97.8°F | Resp 17 | Ht 66.0 in | Wt 188.0 lb

## 2022-05-07 DIAGNOSIS — E782 Mixed hyperlipidemia: Secondary | ICD-10-CM

## 2022-05-07 DIAGNOSIS — I209 Angina pectoris, unspecified: Secondary | ICD-10-CM | POA: Insufficient documentation

## 2022-05-07 DIAGNOSIS — E7841 Elevated Lipoprotein(a): Secondary | ICD-10-CM

## 2022-05-07 DIAGNOSIS — I200 Unstable angina: Secondary | ICD-10-CM

## 2022-05-07 DIAGNOSIS — R931 Abnormal findings on diagnostic imaging of heart and coronary circulation: Secondary | ICD-10-CM | POA: Insufficient documentation

## 2022-05-07 DIAGNOSIS — I10 Essential (primary) hypertension: Secondary | ICD-10-CM

## 2022-05-07 MED ORDER — NITROGLYCERIN 0.4 MG SL SUBL: 0.4000 mg | SUBLINGUAL_TABLET | SUBLINGUAL | 3 refills | Status: AC | PRN

## 2022-05-07 NOTE — Progress Notes (Signed)
Primary Physician/Referring:  Aura Dials, MD  Patient ID: Mallory Lewis, female    DOB: 12/31/1955, 66 y.o.   MRN: [id number]  Chief Complaint  Patient presents with   Follow-up   Shortness of Breath   Jaw Pain   Fatigue    HPI: Mallory Lewis  is a 66 y.o. female  with type II diabetes mellitus, GERD, biopsy-proven nephrotic syndrome (FSGS NOS), on tacrolimus therapy, severe mixed hyperlipidemia related to nephrotic syndrome, elevated LP(a), elevated coronary calcium score in the 95th percentile, family history of premature CAD in her brother at age 53Y  presents for annual visit for angina pectoris.   Patient made today's urgent appt due to chest pain. "Yesterday (05/04/2022) [place] the [place], in the [place], I started not feeling well. Started to drive home with a/c on, I became nauseous, a little SOB, already profusely sweating and had some achiness in my left arm, jaw and fullness in my neck and chest. No real chest pain. It lasted for most of the 3+ hour drive home off and on. I came in and laid down for about 2+ hours with improvement. Today, I've felt more fatigued than usual but did all I needed to do with some occasional heaviness in my left arm accompanied by a fullness in my throat again. I did not have an extra Imdur with to take as you had suggested. To be honest, I'm still kinda shook over it happening. What do you think? "  Patient again had milder several episodes of chest pain again radiating to the arms and neck lasting few minutes and subsiding spontaneously or with taking Imdur.  For the first time since yesterday afternoon she has not had any further episodes.  Today she is feeling better.  Past Medical History:  Diagnosis Date   Allergy    Anemia    Anginal pain ([place])    Arthritis    Blood transfusion without reported diagnosis    Cancer ([place])    skin cancer with MOHS    Chronic kidney disease    Constipation    uses Senna daily     Diabetes mellitus without complication (HCC)    GERD (gastroesophageal reflux disease)    Heart murmur    acute nephrotic syndrome    HLD (hyperlipidemia) 06/20/2019   HTN (hypertension) 06/20/2019   Hypercholesteremia    Hypertension    Osteopenia     Past Surgical History:  Procedure Laterality Date   ABDOMINAL HYSTERECTOMY     APPENDECTOMY     CHOLECYSTECTOMY N/A 09/17/2021   Procedure: LAPAROSCOPIC CHOLECYSTECTOMY;  Surgeon: Jovita Kussmaul, MD;  Location: [facility];  Service: General;  Laterality: N/A;   CHOLECYSTECTOMY  09/2021   COLONOSCOPY     mohs procedure     PELVIC FLOOR REPAIR     POLYPECTOMY     TOTAL HIP ARTHROPLASTY Right 08/29/2018   Procedure: TOTAL HIP ARTHROPLASTY ANTERIOR APPROACH;  Surgeon: Melrose Nakayama, MD;  Location: [place];  Service: Orthopedics;  Laterality: Right;   Social History   Tobacco Use   Smoking status: Former    Packs/day: 0.25    Years: 20.00    Total pack years: 5.00    Types: Cigarettes    Start date: 50    Quit date: 01/04/2014    Years since quitting: 8.3   Smokeless tobacco: Never  Substance Use Topics   Alcohol use: Yes    Comment: occ   Family History  Problem Relation Age of Onset   Hypothyroidism Mother    Atrial fibrillation Mother    Diabetes Mother    Colon polyps Mother    Dementia Father    CAD Father    Diabetes Father    Colon polyps Father    Esophageal cancer Maternal Uncle    Colon cancer Neg Hx    Rectal cancer Neg Hx    Stomach cancer Neg Hx    Review of Systems  Cardiovascular:  Positive for chest pain. Negative for dyspnea on exertion and leg swelling.  Musculoskeletal:  Positive for joint pain.  Gastrointestinal:  Negative for melena.   Objective  Blood pressure 122/74, pulse 92, temperature 97.8 F (36.6 C), temperature source Temporal, resp. rate 17, height '5\' 6"'$  (1.676 m), weight 188 lb (85.3 kg), SpO2 98 %. Body mass index is 30.34 kg/m.    05/07/2022   10:44 AM 09/17/2021   12:09 PM  09/17/2021   11:15 AM  Vitals with BMI  Height '5\' 6"'$     Weight 188 lbs    BMI 24.26    Systolic 834 196   Diastolic 74 80   Pulse 92 70 76    Physical Exam Neck:     Vascular: No carotid bruit or JVD.  Cardiovascular:     Rate and Rhythm: Normal rate and regular rhythm.     Pulses: Normal pulses and intact distal pulses.     Heart sounds: No murmur heard.    No gallop.  Pulmonary:     Effort: Pulmonary effort is normal.     Breath sounds: Normal breath sounds.  Abdominal:     General: Bowel sounds are normal.     Palpations: Abdomen is soft.  Musculoskeletal:     Right lower leg: No edema.     Left lower leg: No edema.    Laboratory examination:     Ref Range & Units  05/15/2020 Apolipoprotein A-1 116 - 209 mg/dL 129  Apolipoprotein B <90 mg/dL 90 High   Comment:                          Desirable               < 90                           Borderline High     90 -  99                                --------------------------------------------------            ASCVD RISK              THERAPEUTIC TARGET             CATEGORY                  APO B (mg/dL)          Very High Risk        <80 (if extreme risk <70)   Apolipo. B/A-1 Ratio 0.0 - 0.6 ratio 0.7 High     Ref Range & Units 05/15/2020 Lipoprotein (a) <75.0 nmol/L 186.8 High     External labs:  Cholesterol, total 127.000 M 03/25/2022 HDL 42.000 MG 03/25/2022 LDL 57.000 MG 03/25/2022 Triglycerides 167.000 M 03/25/2022  A1C 7.000 % 03/25/2022  Creatinine, Serum 0.730 MG/ 03/25/2022 Potassium 3.800 mm 09/10/2021 ALT (SGPT) 26.000 IU/ 03/18/2022  TSH 2.890 04/04/2019  Hemoglobin 14.600 G/ 03/18/2022 Platelets 232.000 X1 03/18/2022  Labs 12/05/2017:  Total cholesterol 333, triglycerides 255, HDL 48, LDL 234. Non-HDL cholesterol 285.   Current Outpatient Medications:    acetaminophen (TYLENOL) 650 MG CR tablet, Take 1,300 mg by mouth every 8 (eight) hours as needed for pain., Disp: , Rfl:    aspirin EC 81 MG  tablet, Take 81 mg by mouth daily., Disp: , Rfl:    carboxymethylcellulose (REFRESH PLUS) 0.5 % SOLN, 1 drop 4 (four) times daily as needed (dry eyes)., Disp: , Rfl:    carvedilol (COREG) 3.125 MG tablet, Take 1 tablet (3.125 mg total) by mouth 2 (two) times daily with a meal., Disp: , Rfl:    Cholecalciferol (VITAMIN D-3) 5000 units TABS, Take 5,000 tablets by mouth every evening. , Disp: , Rfl:    cyclobenzaprine (FLEXERIL) 10 MG tablet, Take 10 mg by mouth 3 (three) times daily as needed for muscle spasms., Disp: , Rfl:    dapagliflozin propanediol (FARXIGA) 5 MG TABS tablet, Take 5 mg by mouth daily., Disp: , Rfl:    ezetimibe (ZETIA) 10 MG tablet, Take 1 tablet (10 mg total) by mouth daily. take after dinner, Disp: 90 tablet, Rfl: 1   Ferrous Sulfate Dried 45 MG TBCR, Take 45 tablets by mouth 2 (two) times daily. , Disp: , Rfl:    isosorbide mononitrate (IMDUR) 30 MG 24 hr tablet, Take 1 tablet (30 mg total) by mouth daily., Disp: 90 tablet, Rfl: 1   KERENDIA 10 MG TABS, Take 1 tablet by mouth daily., Disp: , Rfl:    losartan (COZAAR) 100 MG tablet, Take 100 mg by mouth at bedtime., Disp: , Rfl:    nitroGLYCERIN (NITROSTAT) 0.4 MG SL tablet, Place 1 tablet (0.4 mg total) under the tongue every 5 (five) minutes as needed for chest pain., Disp: 90 tablet, Rfl: 3   omeprazole (PRILOSEC) 40 MG capsule, Take 40 mg by mouth daily., Disp: , Rfl:    rosuvastatin (CRESTOR) 10 MG tablet, Take 1 tablet (10 mg total) by mouth daily. (Patient taking differently: Take 10 mg by mouth at bedtime.), Disp: 90 tablet, Rfl: 1   senna (SENOKOT) 8.6 MG tablet, Take 2 tablets by mouth daily as needed for constipation., Disp: , Rfl:    tacrolimus (PROGRAF) 1 MG capsule, Take 1 mg by mouth 2 (two) times daily., Disp: , Rfl:    torsemide (DEMADEX) 20 MG tablet, Take 20 mg by mouth 2 (two) times daily as needed (fluid)., Disp: , Rfl:    Meds ordered this encounter  Medications   nitroGLYCERIN (NITROSTAT) 0.4 MG SL  tablet    Sig: Place 1 tablet (0.4 mg total) under the tongue every 5 (five) minutes as needed for chest pain.    Dispense:  90 tablet    Refill:  3   Medications Discontinued During This Encounter  Medication Reason   potassium chloride SA (KLOR-CON) 20 MEQ tablet No longer needed (for PRN medications)      Radiology:   Coronary Calcium Score 02/11/2020: 1. Three-vessel coronary artery calcification. 2. Total Agatston Score: 423 3. MESA age and sex matched database percentile: 95 4. Small RIGHT lobe pulmonary nodule. No follow-up needed if patient is low-risk. Non-contrast chest CT can be considered in 12 months if patient is high-risk.  Cardiac Studies:   Echocardiogram 01/07/2021: Normal LV systolic function with visual EF 60-65%.  Left ventricle cavity is normal in size. Mild left ventricular hypertrophy. Normal global wall motion. Normal diastolic filling pattern, normal LAP.  Aortic valve sclerosis without stenosis. Mild tricuspid regurgitation. No evidence of pulmonary hypertension. Compared to prior study dated 12/16/2017: no significant change.   Exercise Myoview stress test 01/12/2021: Exercise nuclear stress test was performed using Bruce protocol. Patient reached 5.8 METS, and 90% of age predicted maximum heart rate. Exercise capacity was low. Non-limiting chest pain reported. Heart rate and hemodynamic response were normal. Stress EKG revealed no ischemic changes. Normal myocardial perfusion. Stress LVEF 60%. Low risk study.  EKG   EKG 05/07/2022: Normal sinus rhythm at rate of 82 bpm, normal axis, no evidence of ischemia, normal EKG.  No significant change from prior EKG done 12/28/2020  Assessment     ICD-10-CM   1. Intermediate coronary syndrome (HCC)  I20.0 EKG 12-Lead    CBC    Basic metabolic panel    nitroGLYCERIN (NITROSTAT) 0.4 MG SL tablet    2. Agatston coronary artery calcium score greater than 400  R93.1     3. Mixed hyperlipidemia  E78.2     4.  Elevated Lp(a) 186 on 05/15/20  E78.41     5. Essential hypertension  I10       Orders Placed This Encounter  Procedures   CBC   Basic metabolic panel   EKG 81-WEXH   Meds ordered this encounter  Medications   nitroGLYCERIN (NITROSTAT) 0.4 MG SL tablet    Sig: Place 1 tablet (0.4 mg total) under the tongue every 5 (five) minutes as needed for chest pain.    Dispense:  90 tablet    Refill:  3    Recommendations:   Mallory Lewis  is a 66 y.o.  with type II diabetes mellitus, GERD, biopsy-proven nephrotic syndrome (FSGS NOS), on tacrolimus therapy, severe mixed hyperlipidemia related to nephrotic syndrome, elevated LP(a), elevated coronary calcium score in the 95th percentile, family history of premature CAD in her brother at age 39Y  presents for an urgent visit for recurrence of angina pectoris.   Her symptoms of chest pain that persist for several hours on and off about 4 days ago, following which she has had recurrence of angina but mild, yesterday she has not had any further chest pains for the first time.  Her symptoms are very concerning for intermediate coronary syndrome.  Her risk factors are well controlled including LDL, hypertension and she is on appropriate medical therapy as well.  S/L NTG was prescribed and explained how to and when to use it and to notify us if there is change in frequency of use.  Advised her to take it easy for now. Patient instructed not to do heavy lifting, heavy exertional activity, swimming until evaluation is complete.  Patient instructed to call if symptoms worse or to go to the ED for further evaluation.  I will schedule her for cardiac catheterization soon.   Schedule for cardiac catheterization, and possible angioplasty. We discussed regarding risks, benefits, alternatives to this including stress testing, CTA and continued medical therapy. Patient wants to proceed. Understands <1-2% risk of death, stroke, MI, urgent CABG, bleeding, infection,  renal failure but not limited to these.    Adrian Prows, MD, [facility] 05/07/2022, 11:26 AM Office: [phone]

## 2022-05-07 NOTE — H&P (View-Only) (Signed)
Primary Physician/Referring:  Mallory Dials, MD  Patient ID: Mallory Lewis, female    DOB: 03-03-1956, 66 y.o.   MRN: [id number]  Chief Complaint  Patient presents with   Follow-up   Shortness of Breath   Jaw Pain   Fatigue    HPI: Mallory Lewis  is a 66 y.o. female  with type II diabetes mellitus, GERD, biopsy-proven nephrotic syndrome (FSGS NOS), on tacrolimus therapy, severe mixed hyperlipidemia related to nephrotic syndrome, elevated LP(a), elevated coronary calcium score in the 95th percentile, family history of premature CAD in her brother at age 46Y  presents for annual visit for angina pectoris.   Patient made today's urgent appt due to chest pain. "Yesterday (05/04/2022) [place] the [place], in the [place], I started not feeling well. Started to drive home with a/c on, I became nauseous, a little SOB, already profusely sweating and had some achiness in my left arm, jaw and fullness in my neck and chest. No real chest pain. It lasted for most of the 3+ hour drive home off and on. I came in and laid down for about 2+ hours with improvement. Today, I've felt more fatigued than usual but did all I needed to do with some occasional heaviness in my left arm accompanied by a fullness in my throat again. I did not have an extra Imdur with to take as you had suggested. To be honest, I'm still kinda shook over it happening. What do you think? "  Patient again had milder several episodes of chest pain again radiating to the arms and neck lasting few minutes and subsiding spontaneously or with taking Imdur.  For the first time since yesterday afternoon she has not had any further episodes.  Today she is feeling better.  Past Medical History:  Diagnosis Date   Allergy    Anemia    Anginal pain ([place])    Arthritis    Blood transfusion without reported diagnosis    Cancer ([place])    skin cancer with MOHS    Chronic kidney disease    Constipation    uses Senna daily     Diabetes mellitus without complication (HCC)    GERD (gastroesophageal reflux disease)    Heart murmur    acute nephrotic syndrome    HLD (hyperlipidemia) 06/20/2019   HTN (hypertension) 06/20/2019   Hypercholesteremia    Hypertension    Osteopenia     Past Surgical History:  Procedure Laterality Date   ABDOMINAL HYSTERECTOMY     APPENDECTOMY     CHOLECYSTECTOMY N/A 09/17/2021   Procedure: LAPAROSCOPIC CHOLECYSTECTOMY;  Surgeon: Jovita Kussmaul, MD;  Location: [facility];  Service: General;  Laterality: N/A;   CHOLECYSTECTOMY  09/2021   COLONOSCOPY     mohs procedure     PELVIC FLOOR REPAIR     POLYPECTOMY     TOTAL HIP ARTHROPLASTY Right 08/29/2018   Procedure: TOTAL HIP ARTHROPLASTY ANTERIOR APPROACH;  Surgeon: Melrose Nakayama, MD;  Location: [facility];  Service: Orthopedics;  Laterality: Right;   Social History   Tobacco Use   Smoking status: Former    Packs/day: 0.25    Years: 20.00    Total pack years: 5.00    Types: Cigarettes    Start date: 87    Quit date: 01/04/2014    Years since quitting: 8.3   Smokeless tobacco: Never  Substance Use Topics   Alcohol use: Yes    Comment: occ   Family History  Problem Relation Age of Onset   Hypothyroidism Mother    Atrial fibrillation Mother    Diabetes Mother    Colon polyps Mother    Dementia Father    CAD Father    Diabetes Father    Colon polyps Father    Esophageal cancer Maternal Uncle    Colon cancer Neg Hx    Rectal cancer Neg Hx    Stomach cancer Neg Hx    Review of Systems  Cardiovascular:  Positive for chest pain. Negative for dyspnea on exertion and leg swelling.  Musculoskeletal:  Positive for joint pain.  Gastrointestinal:  Negative for melena.   Objective  Blood pressure 122/74, pulse 92, temperature 97.8 F (36.6 C), temperature source Temporal, resp. rate 17, height '5\' 6"'$  (1.676 m), weight 188 lb (85.3 kg), SpO2 98 %. Body mass index is 30.34 kg/m.    05/07/2022   10:44 AM 09/17/2021   12:09 PM  09/17/2021   11:15 AM  Vitals with BMI  Height '5\' 6"'$     Weight 188 lbs    BMI 38.75    Systolic 643 329   Diastolic 74 80   Pulse 92 70 76    Physical Exam Neck:     Vascular: No carotid bruit or JVD.  Cardiovascular:     Rate and Rhythm: Normal rate and regular rhythm.     Pulses: Normal pulses and intact distal pulses.     Heart sounds: No murmur heard.    No gallop.  Pulmonary:     Effort: Pulmonary effort is normal.     Breath sounds: Normal breath sounds.  Abdominal:     General: Bowel sounds are normal.     Palpations: Abdomen is soft.  Musculoskeletal:     Right lower leg: No edema.     Left lower leg: No edema.    Laboratory examination:     Ref Range & Units  05/15/2020 Apolipoprotein A-1 116 - 209 mg/dL 129  Apolipoprotein B <90 mg/dL 90 High   Comment:                          Desirable               < 90                           Borderline High     90 -  99                                --------------------------------------------------            ASCVD RISK              THERAPEUTIC TARGET             CATEGORY                  APO B (mg/dL)          Very High Risk        <80 (if extreme risk <70)   Apolipo. B/A-1 Ratio 0.0 - 0.6 ratio 0.7 High     Ref Range & Units 05/15/2020 Lipoprotein (a) <75.0 nmol/L 186.8 High     External labs:  Cholesterol, total 127.000 M 03/25/2022 HDL 42.000 MG 03/25/2022 LDL 57.000 MG 03/25/2022 Triglycerides 167.000 M 03/25/2022  A1C 7.000 % 03/25/2022  Creatinine, Serum 0.730 MG/ 03/25/2022 Potassium 3.800 mm 09/10/2021 ALT (SGPT) 26.000 IU/ 03/18/2022  TSH 2.890 04/04/2019  Hemoglobin 14.600 G/ 03/18/2022 Platelets 232.000 X1 03/18/2022  Labs 12/05/2017:  Total cholesterol 333, triglycerides 255, HDL 48, LDL 234. Non-HDL cholesterol 285.   Current Outpatient Medications:    acetaminophen (TYLENOL) 650 MG CR tablet, Take 1,300 mg by mouth every 8 (eight) hours as needed for pain., Disp: , Rfl:    aspirin EC 81 MG  tablet, Take 81 mg by mouth daily., Disp: , Rfl:    carboxymethylcellulose (REFRESH PLUS) 0.5 % SOLN, 1 drop 4 (four) times daily as needed (dry eyes)., Disp: , Rfl:    carvedilol (COREG) 3.125 MG tablet, Take 1 tablet (3.125 mg total) by mouth 2 (two) times daily with a meal., Disp: , Rfl:    Cholecalciferol (VITAMIN D-3) 5000 units TABS, Take 5,000 tablets by mouth every evening. , Disp: , Rfl:    cyclobenzaprine (FLEXERIL) 10 MG tablet, Take 10 mg by mouth 3 (three) times daily as needed for muscle spasms., Disp: , Rfl:    dapagliflozin propanediol (FARXIGA) 5 MG TABS tablet, Take 5 mg by mouth daily., Disp: , Rfl:    ezetimibe (ZETIA) 10 MG tablet, Take 1 tablet (10 mg total) by mouth daily. take after dinner, Disp: 90 tablet, Rfl: 1   Ferrous Sulfate Dried 45 MG TBCR, Take 45 tablets by mouth 2 (two) times daily. , Disp: , Rfl:    isosorbide mononitrate (IMDUR) 30 MG 24 hr tablet, Take 1 tablet (30 mg total) by mouth daily., Disp: 90 tablet, Rfl: 1   KERENDIA 10 MG TABS, Take 1 tablet by mouth daily., Disp: , Rfl:    losartan (COZAAR) 100 MG tablet, Take 100 mg by mouth at bedtime., Disp: , Rfl:    nitroGLYCERIN (NITROSTAT) 0.4 MG SL tablet, Place 1 tablet (0.4 mg total) under the tongue every 5 (five) minutes as needed for chest pain., Disp: 90 tablet, Rfl: 3   omeprazole (PRILOSEC) 40 MG capsule, Take 40 mg by mouth daily., Disp: , Rfl:    rosuvastatin (CRESTOR) 10 MG tablet, Take 1 tablet (10 mg total) by mouth daily. (Patient taking differently: Take 10 mg by mouth at bedtime.), Disp: 90 tablet, Rfl: 1   senna (SENOKOT) 8.6 MG tablet, Take 2 tablets by mouth daily as needed for constipation., Disp: , Rfl:    tacrolimus (PROGRAF) 1 MG capsule, Take 1 mg by mouth 2 (two) times daily., Disp: , Rfl:    torsemide (DEMADEX) 20 MG tablet, Take 20 mg by mouth 2 (two) times daily as needed (fluid)., Disp: , Rfl:    Meds ordered this encounter  Medications   nitroGLYCERIN (NITROSTAT) 0.4 MG SL  tablet    Sig: Place 1 tablet (0.4 mg total) under the tongue every 5 (five) minutes as needed for chest pain.    Dispense:  90 tablet    Refill:  3   Medications Discontinued During This Encounter  Medication Reason   potassium chloride SA (KLOR-CON) 20 MEQ tablet No longer needed (for PRN medications)      Radiology:   Coronary Calcium Score 02/11/2020: 1. Three-vessel coronary artery calcification. 2. Total Agatston Score: 423 3. MESA age and sex matched database percentile: 95 4. Small RIGHT lobe pulmonary nodule. No follow-up needed if patient is low-risk. Non-contrast chest CT can be considered in 12 months if patient is high-risk.  Cardiac Studies:   Echocardiogram 01/07/2021: Normal LV systolic function with visual EF 60-65%.  Left ventricle cavity is normal in size. Mild left ventricular hypertrophy. Normal global wall motion. Normal diastolic filling pattern, normal LAP.  Aortic valve sclerosis without stenosis. Mild tricuspid regurgitation. No evidence of pulmonary hypertension. Compared to prior study dated 12/16/2017: no significant change.   Exercise Myoview stress test 01/12/2021: Exercise nuclear stress test was performed using Bruce protocol. Patient reached 5.8 METS, and 90% of age predicted maximum heart rate. Exercise capacity was low. Non-limiting chest pain reported. Heart rate and hemodynamic response were normal. Stress EKG revealed no ischemic changes. Normal myocardial perfusion. Stress LVEF 60%. Low risk study.  EKG   EKG 05/07/2022: Normal sinus rhythm at rate of 82 bpm, normal axis, no evidence of ischemia, normal EKG.  No significant change from prior EKG done 12/28/2020  Assessment     ICD-10-CM   1. Intermediate coronary syndrome (HCC)  I20.0 EKG 12-Lead    CBC    Basic metabolic panel    nitroGLYCERIN (NITROSTAT) 0.4 MG SL tablet    2. Agatston coronary artery calcium score greater than 400  R93.1     3. Mixed hyperlipidemia  E78.2     4.  Elevated Lp(a) 186 on 05/15/20  E78.41     5. Essential hypertension  I10       Orders Placed This Encounter  Procedures   CBC   Basic metabolic panel   EKG 62-IWLN   Meds ordered this encounter  Medications   nitroGLYCERIN (NITROSTAT) 0.4 MG SL tablet    Sig: Place 1 tablet (0.4 mg total) under the tongue every 5 (five) minutes as needed for chest pain.    Dispense:  90 tablet    Refill:  3    Recommendations:   Mallory Lewis  is a 66 y.o.  with type II diabetes mellitus, GERD, biopsy-proven nephrotic syndrome (FSGS NOS), on tacrolimus therapy, severe mixed hyperlipidemia related to nephrotic syndrome, elevated LP(a), elevated coronary calcium score in the 95th percentile, family history of premature CAD in her brother at age 26Y  presents for an urgent visit for recurrence of angina pectoris.   Her symptoms of chest pain that persist for several hours on and off about 4 days ago, following which she has had recurrence of angina but mild, yesterday she has not had any further chest pains for the first time.  Her symptoms are very concerning for intermediate coronary syndrome.  Her risk factors are well controlled including LDL, hypertension and she is on appropriate medical therapy as well.  S/L NTG was prescribed and explained how to and when to use it and to notify us if there is change in frequency of use.  Advised her to take it easy for now. Patient instructed not to do heavy lifting, heavy exertional activity, swimming until evaluation is complete.  Patient instructed to call if symptoms worse or to go to the ED for further evaluation.  I will schedule her for cardiac catheterization soon.   Schedule for cardiac catheterization, and possible angioplasty. We discussed regarding risks, benefits, alternatives to this including stress testing, CTA and continued medical therapy. Patient wants to proceed. Understands <1-2% risk of death, stroke, MI, urgent CABG, bleeding, infection,  renal failure but not limited to these.    Mallory Prows, MD, [facility] 05/07/2022, 11:26 AM Office: [phone]

## 2022-05-08 LAB — CBC
Hematocrit: 44.1 % (ref 34.0–46.6)
Hemoglobin: 15.3 g/dL (ref 11.1–15.9)
MCH: 29.5 pg (ref 26.6–33.0)
MCHC: 34.7 g/dL (ref 31.5–35.7)
MCV: 85 fL (ref 79–97)
Platelets: 233 10*3/uL (ref 150–450)
RBC: 5.18 x10E6/uL (ref 3.77–5.28)
RDW: 12.3 % (ref 11.7–15.4)
WBC: 8.7 10*3/uL (ref 3.4–10.8)

## 2022-05-08 LAB — BASIC METABOLIC PANEL
BUN/Creatinine Ratio: 18 (ref 12–28)
BUN: 16 mg/dL (ref 8–27)
CO2: 21 mmol/L (ref 20–29)
Calcium: 9.9 mg/dL (ref 8.7–10.3)
Chloride: 100 mmol/L (ref 96–106)
Creatinine, Ser: 0.87 mg/dL (ref 0.57–1.00)
Glucose: 134 mg/dL — ABNORMAL HIGH (ref 70–99)
Potassium: 4.2 mmol/L (ref 3.5–5.2)
Sodium: 140 mmol/L (ref 134–144)
eGFR: 73 mL/min/{1.73_m2} (ref >59)

## 2022-05-11 ENCOUNTER — Ambulatory Visit (HOSPITAL_COMMUNITY)
Admission: RE | Admit: 2022-05-11 | Discharge: 2022-05-11 | Disposition: A | Discharge status: Home / Self Care | Payer: Medicare Other | Attending: Cardiology | Admitting: Cardiology

## 2022-05-11 ENCOUNTER — Other Ambulatory Visit: Payer: Self-pay

## 2022-05-11 ENCOUNTER — Encounter (HOSPITAL_COMMUNITY)
Admission: RE | Disposition: A | Discharge status: Home / Self Care | Payer: Self-pay | Source: Home / Self Care | Attending: Cardiology

## 2022-05-11 DIAGNOSIS — K219 Gastro-esophageal reflux disease without esophagitis: Secondary | ICD-10-CM | POA: Insufficient documentation

## 2022-05-11 DIAGNOSIS — Z7984 Long term (current) use of oral hypoglycemic drugs: Secondary | ICD-10-CM | POA: Diagnosis not present

## 2022-05-11 DIAGNOSIS — E782 Mixed hyperlipidemia: Secondary | ICD-10-CM | POA: Insufficient documentation

## 2022-05-11 DIAGNOSIS — I25110 Atherosclerotic heart disease of native coronary artery with unstable angina pectoris: Secondary | ICD-10-CM | POA: Diagnosis not present

## 2022-05-11 DIAGNOSIS — Z8249 Family history of ischemic heart disease and other diseases of the circulatory system: Secondary | ICD-10-CM | POA: Diagnosis not present

## 2022-05-11 DIAGNOSIS — E7841 Elevated Lipoprotein(a): Secondary | ICD-10-CM | POA: Insufficient documentation

## 2022-05-11 DIAGNOSIS — Z87891 Personal history of nicotine dependence: Secondary | ICD-10-CM | POA: Diagnosis not present

## 2022-05-11 DIAGNOSIS — I200 Unstable angina: Secondary | ICD-10-CM | POA: Diagnosis present

## 2022-05-11 DIAGNOSIS — I10 Essential (primary) hypertension: Secondary | ICD-10-CM | POA: Insufficient documentation

## 2022-05-11 DIAGNOSIS — E119 Type 2 diabetes mellitus without complications: Secondary | ICD-10-CM | POA: Insufficient documentation

## 2022-05-11 HISTORY — PX: LEFT HEART CATH AND CORONARY ANGIOGRAPHY: CATH118249

## 2022-05-11 LAB — GLUCOSE, CAPILLARY: Glucose-Capillary: 179 mg/dL — ABNORMAL HIGH (ref 70–99)

## 2022-05-11 SURGERY — LEFT HEART CATH AND CORONARY ANGIOGRAPHY
Anesthesia: LOCAL

## 2022-05-11 MED ORDER — SODIUM CHLORIDE 0.9 % WEIGHT BASED INFUSION
3.0000 mL/kg/h | INTRAVENOUS | Status: DC
Start: 2022-05-12 — End: 2022-05-11
  Administered 2022-05-11: 3 mL/kg/h via INTRAVENOUS

## 2022-05-11 MED ORDER — MIDAZOLAM HCL 2 MG/2ML IJ SOLN
INTRAMUSCULAR | Status: DC | PRN
  Administered 2022-05-11: 2 mg via INTRAVENOUS

## 2022-05-11 MED ORDER — NITROGLYCERIN 1 MG/10 ML FOR IR/CATH LAB
INTRA_ARTERIAL | Status: AC
  Filled 2022-05-11: qty 10

## 2022-05-11 MED ORDER — VERAPAMIL HCL 2.5 MG/ML IV SOLN
INTRAVENOUS | Status: AC
Start: 2022-05-11 — End: ?
  Filled 2022-05-11: qty 2

## 2022-05-11 MED ORDER — LIDOCAINE HCL (PF) 1 % IJ SOLN
INTRAMUSCULAR | Status: DC | PRN
  Administered 2022-05-11: 2 mL

## 2022-05-11 MED ORDER — VERAPAMIL HCL 2.5 MG/ML IV SOLN
INTRA_ARTERIAL | Status: DC | PRN
  Administered 2022-05-11: 10 mL via INTRA_ARTERIAL

## 2022-05-11 MED ORDER — HEPARIN (PORCINE) IN NACL 1000-0.9 UT/500ML-% IV SOLN
INTRAVENOUS | Status: AC
  Filled 2022-05-11: qty 1000

## 2022-05-11 MED ORDER — MIDAZOLAM HCL 2 MG/2ML IJ SOLN
INTRAMUSCULAR | Status: AC
  Filled 2022-05-11: qty 2

## 2022-05-11 MED ORDER — ASPIRIN 81 MG PO CHEW: 81.0000 mg | CHEWABLE_TABLET | ORAL | Status: DC

## 2022-05-11 MED ORDER — SODIUM CHLORIDE 0.9 % WEIGHT BASED INFUSION
1.0000 mL/kg/h | INTRAVENOUS | Status: DC
Start: 2022-05-12 — End: 2022-05-11

## 2022-05-11 MED ORDER — FENTANYL CITRATE (PF) 100 MCG/2ML IJ SOLN
INTRAMUSCULAR | Status: DC | PRN
  Administered 2022-05-11: 25 ug via INTRAVENOUS

## 2022-05-11 MED ORDER — LIDOCAINE HCL (PF) 1 % IJ SOLN
INTRAMUSCULAR | Status: AC
  Filled 2022-05-11: qty 30

## 2022-05-11 MED ORDER — SODIUM CHLORIDE 0.9 % IV SOLN: 250.0000 mL | INTRAVENOUS | Status: DC | PRN

## 2022-05-11 MED ORDER — METHYLPREDNISOLONE SODIUM SUCC 125 MG IJ SOLR
125.0000 mg | Freq: Once | INTRAMUSCULAR | Status: AC
  Administered 2022-05-11: 125 mg via INTRAVENOUS
  Filled 2022-05-11: qty 2

## 2022-05-11 MED ORDER — SODIUM CHLORIDE 0.9% FLUSH: 3.0000 mL | INTRAVENOUS | Status: DC | PRN

## 2022-05-11 MED ORDER — FENTANYL CITRATE (PF) 100 MCG/2ML IJ SOLN
INTRAMUSCULAR | Status: AC
  Filled 2022-05-11: qty 2

## 2022-05-11 MED ORDER — SODIUM CHLORIDE 0.9 % WEIGHT BASED INFUSION: 1.0000 mL/kg/h | INTRAVENOUS | Status: DC

## 2022-05-11 MED ORDER — HEPARIN SODIUM (PORCINE) 1000 UNIT/ML IJ SOLN
INTRAMUSCULAR | Status: DC | PRN
  Administered 2022-05-11: 4000 [IU] via INTRAVENOUS

## 2022-05-11 MED ORDER — DIPHENHYDRAMINE HCL 50 MG/ML IJ SOLN
25.0000 mg | Freq: Once | INTRAMUSCULAR | Status: AC
  Administered 2022-05-11: 25 mg via INTRAVENOUS
  Filled 2022-05-11: qty 1

## 2022-05-11 MED ORDER — IOHEXOL 350 MG/ML SOLN
INTRAVENOUS | Status: DC | PRN
  Administered 2022-05-11: 70 mL

## 2022-05-11 MED ORDER — SODIUM CHLORIDE 0.9% FLUSH: 3.0000 mL | Freq: Two times a day (BID) | INTRAVENOUS | Status: DC

## 2022-05-11 MED ORDER — HEPARIN (PORCINE) IN NACL 1000-0.9 UT/500ML-% IV SOLN
INTRAVENOUS | Status: DC | PRN
  Administered 2022-05-11 (×2): 500 mL

## 2022-05-11 MED ORDER — ACETAMINOPHEN 325 MG PO TABS: 650.0000 mg | ORAL_TABLET | ORAL | Status: DC | PRN

## 2022-05-11 MED ORDER — HEPARIN SODIUM (PORCINE) 1000 UNIT/ML IJ SOLN
INTRAMUSCULAR | Status: AC
  Filled 2022-05-11: qty 10

## 2022-05-11 MED ORDER — SODIUM CHLORIDE 0.9% FLUSH
3.0000 mL | Freq: Two times a day (BID) | INTRAVENOUS | Status: DC
Start: 2022-05-11 — End: 2022-05-11

## 2022-05-11 MED ORDER — ONDANSETRON HCL 4 MG/2ML IJ SOLN: 4.0000 mg | Freq: Four times a day (QID) | INTRAMUSCULAR | Status: DC | PRN

## 2022-05-11 SURGICAL SUPPLY — 10 items
BAND ZEPHYR COMPRESS 30 LONG (HEMOSTASIS) ×1 IMPLANT
CATH INFINITI 5FR ANG PIGTAIL (CATHETERS) ×1 IMPLANT
CATH OPTITORQUE TIG 4.0 5F (CATHETERS) ×1 IMPLANT
GLIDESHEATH SLEND A-KIT 6F 22G (SHEATH) ×1 IMPLANT
GUIDEWIRE INQWIRE 1.5J.035X260 (WIRE) IMPLANT
INQWIRE 1.5J .035X260CM (WIRE) ×2
KIT HEART LEFT (KITS) ×2 IMPLANT
PACK CARDIAC CATHETERIZATION (CUSTOM PROCEDURE TRAY) ×2 IMPLANT
TRANSDUCER W/STOPCOCK (MISCELLANEOUS) ×2 IMPLANT
TUBING CIL FLEX 10 FLL-RA (TUBING) ×2 IMPLANT

## 2022-05-11 NOTE — CV Procedure (Signed)
Left Heart Catheterization 05/11/22:  Mild nonobstructive coronary disease The right coronary artery and proximal and mid LAD without significant stenosis, 10 to 20% stenosis.  Right dominant circulation.  2 separate ostia for the circumflex and LAD. Normal LV systolic function, EF 65 to 70%.  No significant mitral regurgitation.  Suspect chest pain related to coronary spasm and endothelial dysfunction.  Medical therapy.   Adrian Prows, MD, [facility] 05/11/2022, 12:59 PM Office: [phone] Fax: [phone] Pager: [phone]

## 2022-05-11 NOTE — Interval H&P Note (Signed)
History and Physical Interval Note:  05/11/2022 12:15 PM  Mallory Lewis  has presented today for surgery, with the diagnosis of shortness of breath.  The various methods of treatment have been discussed with the patient and family. After consideration of risks, benefits and other options for treatment, the patient has consented to  Procedure(s): LEFT HEART CATH AND CORONARY ANGIOGRAPHY (N/A) and possible angioplasty as a surgical intervention.  The patient's history has been reviewed, patient examined, no change in status, stable for surgery.  I have reviewed the patient's chart and labs.  Questions were answered to the patient's satisfaction.    Cath Lab Visit (complete for each Cath Lab visit)  Clinical Evaluation Leading to the Procedure:   ACS: Yes.    Non-ACS:    Anginal Classification: CCS IV  Anti-ischemic medical therapy: Minimal Therapy (1 class of medications)  Non-Invasive Test Results: No non-invasive testing performed  Prior CABG: No previous CABG Adrian Prows

## 2022-05-12 ENCOUNTER — Encounter (HOSPITAL_COMMUNITY): Payer: Self-pay | Admitting: Cardiology

## 2022-05-21 NOTE — Telephone Encounter (Signed)
From patient.

## 2022-06-18 ENCOUNTER — Ambulatory Visit: Admitting: Cardiology

## 2022-06-24 ENCOUNTER — Ambulatory Visit: Payer: Medicare Other | Admitting: Cardiology

## 2022-06-24 ENCOUNTER — Encounter: Payer: Self-pay | Admitting: Cardiology

## 2022-06-24 VITALS — BP 117/74 | HR 90 | Temp 97.7°F | Resp 16 | Ht 66.0 in | Wt 188.0 lb

## 2022-06-24 DIAGNOSIS — I25111 Atherosclerotic heart disease of native coronary artery with angina pectoris with documented spasm: Secondary | ICD-10-CM

## 2022-06-24 DIAGNOSIS — E782 Mixed hyperlipidemia: Secondary | ICD-10-CM

## 2022-06-24 DIAGNOSIS — I209 Angina pectoris, unspecified: Secondary | ICD-10-CM

## 2022-06-24 DIAGNOSIS — E7841 Elevated Lipoprotein(a): Secondary | ICD-10-CM

## 2022-06-24 MED ORDER — ISOSORBIDE MONONITRATE ER 30 MG PO TB24: 30.0000 mg | ORAL_TABLET | Freq: Every day | ORAL | 3 refills | Status: DC

## 2022-06-24 MED ORDER — EZETIMIBE 10 MG PO TABS: 10.0000 mg | ORAL_TABLET | Freq: Every day | ORAL | 3 refills | Status: AC

## 2022-06-24 NOTE — Progress Notes (Signed)
Primary Physician/Referring:  Aura Dials, MD  Patient ID: Mallory Lewis, female    DOB: 10-27-1955, 66 y.o.   MRN: [id number]  Chief Complaint  Patient presents with   Coronary Artery Disease   Post cath     HPI: Mallory Lewis  is a 66 y.o. female  with type II diabetes mellitus, GERD, biopsy-proven nephrotic syndrome (FSGS NOS), on tacrolimus therapy, severe mixed hyperlipidemia related to nephrotic syndrome, elevated LP(a), elevated coronary calcium score in the 95th percentile, family history of premature CAD in her brother at age 35Y seen by me about 6 weeks ago for intermediate coronary syndrome presentation with recurrent episodes of chest pain which was classic for angina pectoris.  She has had few recurrent episodes especially during times of stress and also if she is exposed to cold weather   Past Medical History:  Diagnosis Date   Allergy    Anemia    Anginal pain ([place])    Arthritis    Blood transfusion without reported diagnosis    Cancer ([place])    skin cancer with MOHS    Chronic kidney disease    Constipation    uses Senna daily    Diabetes mellitus without complication (HCC)    GERD (gastroesophageal reflux disease)    Heart murmur    acute nephrotic syndrome    HLD (hyperlipidemia) 06/20/2019   HTN (hypertension) 06/20/2019   Hypercholesteremia    Hypertension    Osteopenia     Past Surgical History:  Procedure Laterality Date   ABDOMINAL HYSTERECTOMY     APPENDECTOMY     CHOLECYSTECTOMY N/A 09/17/2021   Procedure: LAPAROSCOPIC CHOLECYSTECTOMY;  Surgeon: Jovita Kussmaul, MD;  Location: [facility];  Service: General;  Laterality: N/A;   CHOLECYSTECTOMY  09/2021   COLONOSCOPY     LEFT HEART CATH AND CORONARY ANGIOGRAPHY N/A 05/11/2022   Procedure: LEFT HEART CATH AND CORONARY ANGIOGRAPHY;  Surgeon: Adrian Prows, MD;  Location: [facility];  Service: Cardiovascular;  Laterality: N/A;   mohs procedure     PELVIC FLOOR REPAIR      POLYPECTOMY     TOTAL HIP ARTHROPLASTY Right 08/29/2018   Procedure: TOTAL HIP ARTHROPLASTY ANTERIOR APPROACH;  Surgeon: Melrose Nakayama, MD;  Location: [facility];  Service: Orthopedics;  Laterality: Right;   Social History   Tobacco Use   Smoking status: Former    Packs/day: 0.25    Years: 20.00    Total pack years: 5.00    Types: Cigarettes    Start date: 102    Quit date: 01/04/2014    Years since quitting: 8.4   Smokeless tobacco: Never  Substance Use Topics   Alcohol use: Yes    Comment: occ   Family History  Problem Relation Age of Onset   Hypothyroidism Mother    Atrial fibrillation Mother    Diabetes Mother    Colon polyps Mother    Dementia Father    CAD Father    Diabetes Father    Colon polyps Father    Esophageal cancer Maternal Uncle    Colon cancer Neg Hx    Rectal cancer Neg Hx    Stomach cancer Neg Hx    Review of Systems  Cardiovascular:  Positive for chest pain. Negative for dyspnea on exertion and leg swelling.  Musculoskeletal:  Positive for joint pain.  Gastrointestinal:  Negative for melena.   Objective  Blood pressure 117/74, pulse 90, temperature 97.7 F (36.5 C), temperature source Temporal, resp.  rate 16, height '5\' 6"'$  (1.676 m), weight 188 lb (85.3 kg), SpO2 94 %. Body mass index is 30.34 kg/m.    06/24/2022    9:57 AM 05/11/2022    2:45 PM 05/11/2022    2:15 PM  Vitals with BMI  Height '5\' 6"'$     Weight 188 lbs    BMI 16.10    Systolic 960 454 098  Diastolic 74 69 68  Pulse 90 97 89    Physical Exam Neck:     Vascular: No carotid bruit or JVD.  Cardiovascular:     Rate and Rhythm: Normal rate and regular rhythm.     Pulses: Normal pulses and intact distal pulses.     Heart sounds: No murmur heard.    No gallop.  Pulmonary:     Effort: Pulmonary effort is normal.     Breath sounds: Normal breath sounds.  Abdominal:     General: Bowel sounds are normal.     Palpations: Abdomen is soft.  Musculoskeletal:     Right lower leg: No  edema.     Left lower leg: No edema.    Laboratory examination:     Ref Range & Units  05/15/2020 Apolipoprotein A-1 116 - 209 mg/dL 129  Apolipoprotein B <90 mg/dL 90 High   Comment:                          Desirable               < 90                           Borderline High     90 -  99                                --------------------------------------------------            ASCVD RISK              THERAPEUTIC TARGET             CATEGORY                  APO B (mg/dL)          Very High Risk        <80 (if extreme risk <70)   Apolipo. B/A-1 Ratio 0.0 - 0.6 ratio 0.7 High     Ref Range & Units 05/15/2020 Lipoprotein (a) <75.0 nmol/L 186.8 High     External labs:  Cholesterol, total 127.000 M 03/25/2022 HDL 42.000 MG 03/25/2022 LDL 57.000 MG 03/25/2022 Triglycerides 167.000 M 03/25/2022  A1C 7.000 % 03/25/2022  Creatinine, Serum 0.730 MG/ 03/25/2022 Potassium 3.800 mm 09/10/2021 ALT (SGPT) 26.000 IU/ 03/18/2022  TSH 2.890 04/04/2019  Hemoglobin 14.600 G/ 03/18/2022 Platelets 232.000 X1 03/18/2022  Labs 12/05/2017:  Total cholesterol 333, triglycerides 255, HDL 48, LDL 234. Non-HDL cholesterol 285.   Current Outpatient Medications:    acetaminophen (TYLENOL) 650 MG CR tablet, Take 1,300 mg by mouth every 8 (eight) hours as needed for pain., Disp: , Rfl:    aspirin EC 81 MG tablet, Take 81 mg by mouth daily., Disp: , Rfl:    carboxymethylcellulose (REFRESH PLUS) 0.5 % SOLN, Place 1 drop into both eyes 4 (four) times daily as needed (dry eyes)., Disp: , Rfl:    carvedilol (COREG) 3.125 MG tablet,  Take 1 tablet (3.125 mg total) by mouth 2 (two) times daily with a meal., Disp: , Rfl:    cetirizine (ZYRTEC) 10 MG tablet, Take 10 mg by mouth daily as needed for allergies., Disp: , Rfl:    Cholecalciferol (VITAMIN D-3) 5000 units TABS, Take 5,000 tablets by mouth every evening. , Disp: , Rfl:    cyclobenzaprine (FLEXERIL) 10 MG tablet, Take 10 mg by mouth 3 (three) times daily as  needed for muscle spasms., Disp: , Rfl:    dapagliflozin propanediol (FARXIGA) 5 MG TABS tablet, Take 5 mg by mouth daily., Disp: , Rfl:    Ferrous Sulfate Dried 45 MG TBCR, Take 45 mg by mouth 2 (two) times daily., Disp: , Rfl:    fluticasone (FLONASE) 50 MCG/ACT nasal spray, Place 1 spray into both nostrils daily as needed for allergies or rhinitis., Disp: , Rfl:    KERENDIA 10 MG TABS, Take 10 mg by mouth daily., Disp: , Rfl:    losartan (COZAAR) 100 MG tablet, Take 100 mg by mouth at bedtime., Disp: , Rfl:    nitroGLYCERIN (NITROSTAT) 0.4 MG SL tablet, Place 1 tablet (0.4 mg total) under the tongue every 5 (five) minutes as needed for chest pain., Disp: 90 tablet, Rfl: 3   omeprazole (PRILOSEC) 40 MG capsule, Take 40 mg by mouth daily., Disp: , Rfl:    rosuvastatin (CRESTOR) 10 MG tablet, Take 1 tablet (10 mg total) by mouth daily. (Patient taking differently: Take 10 mg by mouth at bedtime.), Disp: 90 tablet, Rfl: 1   senna (SENOKOT) 8.6 MG tablet, Take 2 tablets by mouth daily as needed for constipation., Disp: , Rfl:    tacrolimus (PROGRAF) 1 MG capsule, Take 1 mg by mouth 2 (two) times daily., Disp: , Rfl:    torsemide (DEMADEX) 20 MG tablet, Take 20 mg by mouth daily. May take an additional dose if needed for fluid, Disp: , Rfl:    ezetimibe (ZETIA) 10 MG tablet, Take 1 tablet (10 mg total) by mouth daily. take after dinner, Disp: 90 tablet, Rfl: 3   isosorbide mononitrate (IMDUR) 30 MG 24 hr tablet, Take 1 tablet (30 mg total) by mouth daily., Disp: 90 tablet, Rfl: 3   Meds ordered this encounter  Medications   ezetimibe (ZETIA) 10 MG tablet    Sig: Take 1 tablet (10 mg total) by mouth daily. take after dinner    Dispense:  90 tablet    Refill:  3   isosorbide mononitrate (IMDUR) 30 MG 24 hr tablet    Sig: Take 1 tablet (30 mg total) by mouth daily.    Dispense:  90 tablet    Refill:  3   Medications Discontinued During This Encounter  Medication Reason   isosorbide mononitrate  (IMDUR) 30 MG 24 hr tablet Reorder   ezetimibe (ZETIA) 10 MG tablet Reorder      Radiology:   Coronary Calcium Score 02/11/2020: 1. Three-vessel coronary artery calcification. 2. Total Agatston Score: 423 3. MESA age and sex matched database percentile: 95 4. Small RIGHT lobe pulmonary nodule. No follow-up needed if patient is low-risk. Non-contrast chest CT can be considered in 12 months if patient is high-risk.  Cardiac Studies:   Echocardiogram 01/07/2021: Normal LV systolic function with visual EF 60-65%. Left ventricle cavity is normal in size. Mild left ventricular hypertrophy. Normal global wall motion. Normal diastolic filling pattern, normal LAP.  Aortic valve sclerosis without stenosis. Mild tricuspid regurgitation. No evidence of pulmonary hypertension. Compared to prior study  dated 12/16/2017: no significant change.   Exercise Myoview stress test 01/12/2021: Exercise nuclear stress test was performed using Bruce protocol. Patient reached 5.8 METS, and 90% of age predicted maximum heart rate. Exercise capacity was low. Non-limiting chest pain reported. Heart rate and hemodynamic response were normal. Stress EKG revealed no ischemic changes. Normal myocardial perfusion. Stress LVEF 60%. Low risk study.  Left Heart Catheterization 05/11/22:  LV: Normal LV systolic function, EF 65 to 70%.  No significant mitral regurgitation.  Normal EDP.  No pressure gradient across the aortic valve. LM: Nonexistent.  Has separate ostia for LAD and CX. LAD: Large vessel, has mild 10 to 20% luminal irregularity in the proximal and mid segment.  No significant disease otherwise. CX: Large vessel, minimal disease. RCA: Dominant.  Proximal to mid segment has mild luminal irregularity of at most 20% stenosis.   Impression: Suspect she probably had coronary spasm at mildly diseased vessels.  Symptoms are very classic for angina pectoris.  Continue medical therapy.  EKG   EKG 05/07/2022: Normal  sinus rhythm at rate of 82 bpm, normal axis, no evidence of ischemia, normal EKG.  No significant change from prior EKG done 12/28/2020  Assessment     ICD-10-CM   1. Coronary artery disease involving native coronary artery of native heart with angina pectoris with documented spasm ([place])  I25.111     2. Angina pectoris (HCC)  I20.9 isosorbide mononitrate (IMDUR) 30 MG 24 hr tablet    3. Mixed hyperlipidemia  E78.2 ezetimibe (ZETIA) 10 MG tablet    4. Elevated Lp(a) 186 on 05/15/20  E78.41       No orders of the defined types were placed in this encounter.  Meds ordered this encounter  Medications   ezetimibe (ZETIA) 10 MG tablet    Sig: Take 1 tablet (10 mg total) by mouth daily. take after dinner    Dispense:  90 tablet    Refill:  3   isosorbide mononitrate (IMDUR) 30 MG 24 hr tablet    Sig: Take 1 tablet (30 mg total) by mouth daily.    Dispense:  90 tablet    Refill:  3    Recommendations:   Kaiah Hosea  is a 66 y.o.  with type II diabetes mellitus, GERD, biopsy-proven nephrotic syndrome (FSGS NOS), on tacrolimus therapy, severe mixed hyperlipidemia related to nephrotic syndrome, elevated LP(a), elevated coronary calcium score in the 95th percentile, family history of premature CAD in her brother at age 64Y seen by me about 6 weeks ago for intermediate coronary syndrome presentation with recurrent episodes of chest pain which was classic for angina pectoris.  She underwent cardiac catheterization on 05/30/2022, fortunately did not reveal significant coronary artery disease.  Suspect coronary spasm as the symptoms are fairly classic.  She has had few recurrent episodes especially during times of stress and also if she is exposed to cold weather again suggesting coronary spasm.  Overall stable, lipids under excellent control, continue present medications and I will see her back in a year or sooner if problems.   Adrian Prows, MD, [facility] 06/24/2022, 10:38 AM Office: [phone]

## 2022-07-14 ENCOUNTER — Ambulatory Visit: Admitting: Cardiology

## 2022-08-25 ENCOUNTER — Telehealth: Payer: Self-pay | Admitting: *Deleted

## 2022-08-25 ENCOUNTER — Encounter: Payer: Self-pay | Admitting: *Deleted

## 2022-08-25 NOTE — Patient Instructions (Signed)
Visit Information  Thank you for taking time to visit with me today. Please don't hesitate to contact me if I can be of assistance to you.   Following are the goals we discussed today:   Goals Addressed               This Visit's Progress     No needs (pt-stated)        Care Coordination Interventions: Reviewed medications with patient and discussed adherence with all medications with no needed refills Reviewed scheduled/upcoming provider appointments including sufficient transportation source Assessed social determinant of health barriers          Please call the care guide team at [phone] if you need to cancel or reschedule your appointment.   If you are experiencing a Mental Health or [place] or need someone to talk to, please call the Suicide and Crisis Lifeline: [phone]  Patient verbalizes understanding of instructions and care plan provided today and agrees to view in [place]. Active MyChart status and patient understanding of how to access instructions and care plan via MyChart confirmed with patient.     No further follow up required: No needs    Raina Mina, RN Care Management Coordinator [place] Office [phone]

## 2022-08-25 NOTE — Patient Outreach (Signed)
  Care Coordination   Initial Visit Note   08/25/2022 Name: Mallory Lewis MRN: [id number] DOB: 09-18-1956  Mallory Lewis is a 66 y.o. year old female who sees Mallory Dials, MD for primary care. I spoke with  Mallory Lewis by phone today.  What matters to the patients health and wellness today?  No needs    Goals Addressed               This Visit's Progress     No needs (pt-stated)        Care Coordination Interventions: Reviewed medications with patient and discussed adherence with all medications with no needed refills Reviewed scheduled/upcoming provider appointments including sufficient transportation source Assessed social determinant of health barriers          SDOH assessments and interventions completed:  Yes  SDOH Interventions Today    Flowsheet Row Most Recent Value  SDOH Interventions   Food Insecurity Interventions Intervention Not Indicated  Housing Interventions Intervention Not Indicated  Transportation Interventions Intervention Not Indicated  Utilities Interventions Intervention Not Indicated        Care Coordination Interventions Activated:  Yes  Care Coordination Interventions:  Yes, provided   Follow up plan: No further intervention required.   Encounter Outcome:  Pt. Visit Completed   Mallory Mina, RN Care Management Coordinator [place] Office [phone]

## 2022-10-07 ENCOUNTER — Telehealth: Payer: Self-pay | Admitting: *Deleted

## 2022-10-07 ENCOUNTER — Encounter: Payer: Self-pay | Admitting: *Deleted

## 2022-10-07 NOTE — Patient Instructions (Signed)
Visit Information  Thank you for taking time to visit with me today. Please don't hesitate to contact me if I can be of assistance to you.   Following are the goals we discussed today:   Goals Addressed             This Visit's Progress    care coordination activity       Care Coordination Interventions: Reviewed medications with patient and discussed adherence with no needed refills Reviewed scheduled/upcoming provider appointments including sufficient transportation Assessed social determinant of health barriers Educated on care management services for social worker, pharmacy and nurse care Freight forwarder. No needs presented at this time.          Please call the care guide team at [phone] if you need to cancel or reschedule your appointment.   If you are experiencing a Mental Health or [place] or need someone to talk to, please call the Suicide and Crisis Lifeline: 988  Patient verbalizes understanding of instructions and care plan provided today and agrees to view in [place]. Active MyChart status and patient understanding of how to access instructions and care plan via MyChart confirmed with patient.     No further follow up required: No follow up needs  Raina Mina, RN Care Management Coordinator [place] Office [phone]

## 2022-10-07 NOTE — Patient Outreach (Signed)
  Care Coordination   Initial Visit Note   10/07/2022 Name: Mallory Lewis MRN: [id number] DOB: 04-Mar-1956  Mallory Lewis is a 66 y.o. year old female who sees Aura Dials, MD for primary care. I spoke with  Renne Musca by phone today.  What matters to the patients health and wellness today?  No needs    Goals Addressed             This Visit's Progress    care coordination activity       Care Coordination Interventions: Reviewed medications with patient and discussed adherence with no needed refills Reviewed scheduled/upcoming provider appointments including sufficient transportation Assessed social determinant of health barriers Educated on care management services for social worker, pharmacy and nurse care Freight forwarder. No needs presented at this time.          SDOH assessments and interventions completed:  Yes  SDOH Interventions Today    Flowsheet Row Most Recent Value  SDOH Interventions   Food Insecurity Interventions Intervention Not Indicated  Housing Interventions Intervention Not Indicated  Transportation Interventions Intervention Not Indicated  Utilities Interventions Intervention Not Indicated        Care Coordination Interventions:  Yes, provided   Follow up plan: No further intervention required.   Encounter Outcome:  Pt. Visit Completed   Raina Mina, RN Care Management Coordinator [place] Office [phone]

## 2023-04-05 ENCOUNTER — Other Ambulatory Visit: Payer: Self-pay | Admitting: Family Medicine

## 2023-04-05 DIAGNOSIS — M810 Age-related osteoporosis without current pathological fracture: Secondary | ICD-10-CM

## 2023-05-13 ENCOUNTER — Encounter (HOSPITAL_BASED_OUTPATIENT_CLINIC_OR_DEPARTMENT_OTHER): Payer: Self-pay

## 2023-05-13 ENCOUNTER — Other Ambulatory Visit: Payer: Self-pay

## 2023-05-13 ENCOUNTER — Emergency Department (HOSPITAL_BASED_OUTPATIENT_CLINIC_OR_DEPARTMENT_OTHER): Payer: Medicare Other

## 2023-05-13 ENCOUNTER — Emergency Department (HOSPITAL_BASED_OUTPATIENT_CLINIC_OR_DEPARTMENT_OTHER)
Admission: EM | Admit: 2023-05-13 | Discharge: 2023-05-13 | Disposition: A | Discharge status: Home / Self Care | Payer: Medicare Other | Attending: Emergency Medicine | Admitting: Emergency Medicine

## 2023-05-13 DIAGNOSIS — R109 Unspecified abdominal pain: Secondary | ICD-10-CM | POA: Insufficient documentation

## 2023-05-13 LAB — CBC
HCT: 42.8 % (ref 36.0–46.0)
Hemoglobin: 15 g/dL (ref 12.0–15.0)
MCH: 29.6 pg (ref 26.0–34.0)
MCHC: 35 g/dL (ref 30.0–36.0)
MCV: 84.6 fL (ref 80.0–100.0)
Platelets: 207 10*3/uL (ref 150–400)
RBC: 5.06 MIL/uL (ref 3.87–5.11)
RDW: 12.2 % (ref 11.5–15.5)
WBC: 12.2 10*3/uL — ABNORMAL HIGH (ref 4.0–10.5)
nRBC: 0 % (ref 0.0–0.2)

## 2023-05-13 LAB — URINALYSIS, ROUTINE W REFLEX MICROSCOPIC
Bilirubin Urine: NEGATIVE
Glucose, UA: NEGATIVE mg/dL
Hgb urine dipstick: NEGATIVE
Ketones, ur: NEGATIVE mg/dL
Leukocytes,Ua: NEGATIVE
Nitrite: NEGATIVE
Protein, ur: NEGATIVE mg/dL
Specific Gravity, Urine: 1.015 (ref 1.005–1.030)
pH: 5 (ref 5.0–8.0)

## 2023-05-13 LAB — BASIC METABOLIC PANEL
Anion gap: 14 (ref 5–15)
BUN: 36 mg/dL — ABNORMAL HIGH (ref 8–23)
CO2: 24 mmol/L (ref 22–32)
Calcium: 9.4 mg/dL (ref 8.9–10.3)
Chloride: 96 mmol/L — ABNORMAL LOW (ref 98–111)
Creatinine, Ser: 1.55 mg/dL — ABNORMAL HIGH (ref 0.44–1.00)
GFR, Estimated: 36 mL/min — ABNORMAL LOW (ref >60)
Glucose, Bld: 102 mg/dL — ABNORMAL HIGH (ref 70–99)
Potassium: 3.8 mmol/L (ref 3.5–5.1)
Sodium: 134 mmol/L — ABNORMAL LOW (ref 135–145)

## 2023-05-13 NOTE — ED Notes (Signed)
Lab called. Blood not received in lab. Still needs to be collected, even though chart says processing.

## 2023-05-13 NOTE — ED Triage Notes (Signed)
Patient has been having right side flank pain since Tuesday that has not started to move into her lower abd. No dysuria or fever.

## 2023-05-13 NOTE — ED Provider Notes (Signed)
[facility] Provider Note   CSN: [id number] Arrival date & time: 05/13/23  1716     History Chief Complaint  Patient presents with   Abdominal Pain   Flank Pain    HPI Mallory Lewis is a 67 y.o. female presenting for flank pain on her right side. She has an exquisite medical history of nephropathy syndrome secondary to FSGS and basement membrane disease.  She follows closely with nephrology in the outpatient setting.  She indicated her clinical course with severe skin fluid buildup secondary to her nephrotic syndrome and has been on torsemide.  She is very strict about taking her medications anytime she detect swelling and weight gain.  Earlier this week she began having feelings of right flank fullness that she attributed to nephropathy and has increased her torsemide dose.  Now she is having fairly persistent cramping on her right side.  She denies fevers chills nausea vomiting syncope shortness of breath..   Patient's recorded medical, surgical, social, medication list and allergies were reviewed in the Snapshot window as part of the initial history.   Review of Systems   Review of Systems  Constitutional:  Negative for chills and fever.  HENT:  Negative for ear pain and sore throat.   Eyes:  Negative for pain and visual disturbance.  Respiratory:  Negative for cough and shortness of breath.   Cardiovascular:  Negative for chest pain and palpitations.  Gastrointestinal:  Negative for abdominal pain and vomiting.  Genitourinary:  Negative for dysuria and hematuria.  Musculoskeletal:  Positive for back pain. Negative for arthralgias.  Skin:  Negative for color change and rash.  Neurological:  Negative for seizures and syncope.  All other systems reviewed and are negative.   Physical Exam Updated Vital Signs BP 97/69   Pulse 87   Temp 97.6 F (36.4 C) (Oral)   Resp 17   Ht 5\' 6"  (1.676 m)   Wt 85 kg   SpO2 98%   BMI  30.25 kg/m  Physical Exam Vitals and nursing note reviewed.  Constitutional:      General: She is not in acute distress.    Appearance: She is well-developed.  HENT:     Head: Normocephalic and atraumatic.  Eyes:     Conjunctiva/sclera: Conjunctivae normal.  Cardiovascular:     Rate and Rhythm: Normal rate and regular rhythm.     Heart sounds: No murmur heard. Pulmonary:     Effort: Pulmonary effort is normal. No respiratory distress.     Breath sounds: Normal breath sounds.  Abdominal:     General: There is no distension.     Palpations: Abdomen is soft.     Tenderness: There is no abdominal tenderness. There is right CVA tenderness. There is no left CVA tenderness.  Musculoskeletal:        General: No swelling or tenderness. Normal range of motion.     Cervical back: Neck supple.  Skin:    General: Skin is warm and dry.  Neurological:     General: No focal deficit present.     Mental Status: She is alert and oriented to person, place, and time. Mental status is at baseline.     Cranial Nerves: No cranial nerve deficit.      ED Course/ Medical Decision Making/ A&P    Procedures Procedures   Medications Ordered in ED Medications - No data to display Medical Decision Making:   Mallory Lewis is a 67  y.o. female who presented to the ED today with right sided flank pain, detailed above.    Patient placed on continuous vitals and telemetry monitoring while in ED which was reviewed periodically.  Complete initial physical exam performed, notably the patient  was hemodynamically stable no acute distress.     Reviewed and confirmed nursing documentation for past medical history, family history, social history.    Initial Assessment:   With the patient's presentation of flank pain, most likely diagnosis is Urologic pathology (including UL vs UTI vs pyelo) vs MSK etiology. Other diagnoses were considered including (but not limited to) gastroenteritis, colitis, small  bowel obstruction, appendicitis, cholecystitis, pancreatitis These are considered less likely due to history of present illness and physical exam findings.   This is most consistent with an acute life/limb threatening illness complicated by underlying chronic conditions.   Initial Plan:  CBC/ Metabolic Panel  to evaluate for underlying infectious/metabolic etiology for patient's abdominal pain  Lipase to evaluate for pancreatitis  EKG to evaluate for cardiac source of pain  CT Ab/pelvis without contrast due to favored urologic over GI etiology for patient's abdominal pain  Urinalysis and repeat physical assessment to evaluate for UTI/Pyelonpehritis  Empiric management of symptoms with escalating pain control and antiemetics as needed.   Initial Study Results:   Laboratory  All laboratory results reviewed without evidence of clinically relevant pathology.   Exceptions include: AKI   EKG EKG was reviewed independently. Rate, rhythm, axis, intervals all examined and without medically relevant abnormality. ST segments without concerns for elevations.    Radiology All images reviewed independently. Agree with radiology report at this time.   CT Renal Stone Study  Result Date: 05/13/2023 CLINICAL DATA:  Abdominal and flank pain EXAM: CT ABDOMEN AND PELVIS WITHOUT CONTRAST TECHNIQUE: Multidetector CT imaging of the abdomen and pelvis was performed following the standard protocol without IV contrast. RADIATION DOSE REDUCTION: This exam was performed according to the departmental dose-optimization program which includes automated exposure control, adjustment of the mA and/or kV according to patient size and/or use of iterative reconstruction technique. COMPARISON:  None Available. FINDINGS: Lower chest: No acute pleural or parenchymal lung disease. Hepatobiliary: Cholecystectomy. Unremarkable unenhanced appearance of the liver. Subcentimeter cyst left lobe liver. Pancreas: Unremarkable unenhanced  appearance. Spleen: Unremarkable unenhanced appearance. Adrenals/Urinary Tract: No urinary tract calculi or obstructive uropathy within either kidney. The adrenals and bladder are unremarkable. Stomach/Bowel: No bowel obstruction or ileus. No bowel wall thickening or inflammatory change. Vascular/Lymphatic: Aortic atherosclerosis. No enlarged abdominal or pelvic lymph nodes. Reproductive: Status post hysterectomy. No adnexal masses. Other: No free fluid or free intraperitoneal gas. No abdominal wall hernia. Musculoskeletal: Right hip arthroplasty. No acute bony abnormalities. Reconstructed images demonstrate no additional findings. IMPRESSION: 1. Unremarkable unenhanced CT of the abdomen and pelvis. No urinary tract calculi or obstructive uropathy. 2.  Aortic Atherosclerosis (ICD10-I70.0). Electronically Signed   By: Sharlet Salina M.D.   On: 05/13/2023 19:38    Final Reassessment and Plan:   Patient's history present illness and physical exam findings are most consistent with a prerenal AKI likely secondary to overdiuresis. She had a similar episode a few weeks ago where she got an AKI and muscle cramping and this improved when she decreased her diuresis to 10 mg a day.  Unfortunately resulted in volume overload.  Ultimately feel patient is over diuresed at this moment and recommended she hold her torsemide at 10 mg per her nephrology's past recommendation and follow-up with nephrology on Monday.  She has  left a phone call stated she will follow-up to them.  She is ambulatory tolerating p.o. intake in no acute distress at this time stable for outpatient care management.  No evidence of diverticulitis on CT scan inconsistent with current lab work as well.      Clinical Impression:  1. Right flank pain      Discharge   Final Clinical Impression(s) / ED Diagnoses Final diagnoses:  Right flank pain    Rx / DC Orders ED Discharge Orders     None         Glyn Ade, MD 05/13/23  2110

## 2023-05-13 NOTE — ED Notes (Signed)
Patient transported to CT 

## 2023-05-13 NOTE — ED Notes (Signed)
ED Provider at bedside. 

## 2023-06-23 ENCOUNTER — Ambulatory Visit: Payer: Medicare Other | Admitting: Cardiology

## 2023-06-23 ENCOUNTER — Encounter: Payer: Self-pay | Admitting: Cardiology

## 2023-06-23 VITALS — BP 101/66 | HR 83 | Resp 16 | Ht 66.0 in | Wt 148.0 lb

## 2023-06-23 DIAGNOSIS — I25111 Atherosclerotic heart disease of native coronary artery with angina pectoris with documented spasm: Secondary | ICD-10-CM

## 2023-06-23 DIAGNOSIS — E782 Mixed hyperlipidemia: Secondary | ICD-10-CM

## 2023-06-23 DIAGNOSIS — I209 Angina pectoris, unspecified: Secondary | ICD-10-CM

## 2023-06-23 NOTE — Progress Notes (Signed)
Primary Physician/Referring:  Henrine Screws, MD  Patient ID: Mallory Lewis, female    DOB: 06/18/1956, 67 y.o.   MRN: [id number]  Chief Complaint  Patient presents with  . Angina pectoris (HCC  . Hyperlipidemia    HPI: Mallory Lewis  is a 67 y.o. female  with type II diabetes mellitus, GERD, biopsy-proven nephrotic syndrome (FSGS NOS), on tacrolimus therapy, severe mixed hyperlipidemia related to nephrotic syndrome, elevated LP(a), elevated coronary calcium score in the 95th percentile, family history of premature CAD in her brother at age 21Y, vasospastic angina presents here for follow-up.  She is presently doing well and episodes of angina has improved significantly.  She has lost about 40 pounds in weight since being on Ozempic.  Past Medical History:  Diagnosis Date  . Allergy   . Anemia   . Anginal pain (HCC)   . Arthritis   . Blood transfusion without reported diagnosis   . Cancer (HCC)    skin cancer with MOHS   . Chronic kidney disease   . Constipation    uses Senna daily   . Diabetes mellitus without complication (HCC)   . GERD (gastroesophageal reflux disease)   . Heart murmur    acute nephrotic syndrome   . HLD (hyperlipidemia) 06/20/2019  . HTN (hypertension) 06/20/2019  . Hypercholesteremia   . Hypertension   . Osteopenia     Past Surgical History:  Procedure Laterality Date  . ABDOMINAL HYSTERECTOMY    . APPENDECTOMY    . CHOLECYSTECTOMY N/A 09/17/2021   Procedure: LAPAROSCOPIC CHOLECYSTECTOMY;  Surgeon: Griselda Miner, MD;  Location: [facility];  Service: General;  Laterality: N/A;  . CHOLECYSTECTOMY  09/2021  . COLONOSCOPY    . LEFT HEART CATH AND CORONARY ANGIOGRAPHY N/A 05/11/2022   Procedure: LEFT HEART CATH AND CORONARY ANGIOGRAPHY;  Surgeon: Yates Decamp, MD;  Location: [facility];  Service: Cardiovascular;  Laterality: N/A;  . mohs procedure    . PELVIC FLOOR REPAIR    . POLYPECTOMY    . TOTAL HIP ARTHROPLASTY Right  08/29/2018   Procedure: TOTAL HIP ARTHROPLASTY ANTERIOR APPROACH;  Surgeon: Marcene Corning, MD;  Location: [facility] OR;  Service: Orthopedics;  Laterality: Right;   Social History   Tobacco Use  . Smoking status: Former    Current packs/day: 0.00    Average packs/day: 0.3 packs/day for 37.2 years (9.3 ttl pk-yrs)    Types: Cigarettes    Start date: 27    Quit date: 01/04/2014    Years since quitting: 9.4  . Smokeless tobacco: Never  Substance Use Topics  . Alcohol use: Yes    Comment: occ   Family History  Problem Relation Age of Onset  . Hypothyroidism Mother   . Atrial fibrillation Mother   . Diabetes Mother   . Colon polyps Mother   . Dementia Father   . CAD Father   . Diabetes Father   . Colon polyps Father   . Esophageal cancer Maternal Uncle   . Colon cancer Neg Hx   . Rectal cancer Neg Hx   . Stomach cancer Neg Hx    Review of Systems  Cardiovascular:  Positive for chest pain (rare). Negative for dyspnea on exertion and leg swelling.  Gastrointestinal:  Negative for melena.   Objective  Blood pressure 101/66, pulse 83, resp. rate 16, height 5\' 6"  (1.676 m), weight 148 lb (67.1 kg), SpO2 98%. Body mass index is 23.89 kg/m.    06/23/2023   10:51 AM  05/13/2023    9:09 PM 05/13/2023    7:45 PM  Vitals with BMI  Height 5\' 6"     Weight 148 lbs    BMI 23.9    Systolic 101 97 103  Diastolic 66 69 80  Pulse 83 87 85    Physical Exam Neck:     Vascular: No carotid bruit or JVD.  Cardiovascular:     Rate and Rhythm: Normal rate and regular rhythm.     Pulses: Normal pulses and intact distal pulses.     Heart sounds: No murmur heard.    No gallop.  Pulmonary:     Effort: Pulmonary effort is normal.     Breath sounds: Normal breath sounds.  Abdominal:     General: Bowel sounds are normal.     Palpations: Abdomen is soft.  Musculoskeletal:     Right lower leg: No edema.     Left lower leg: No edema.   Laboratory examination:     Ref Range &  Units 05/15/2020 Lipoprotein (a) <75.0 nmol/L 186.8 High     External labs:  Cholesterol, total 127.000 M 04/06/2023 HDL 41.000 MG 04/06/2023 LDL 33.000 mg 06/29/2022 Triglycerides 177.000 M 04/06/2023  A1C 6.000 % 04/06/2023 TSH 3.020 04/06/2023  Hemoglobin 15.000 g/d 05/13/2023 Platelets 207.000 K/ 05/13/2023  Creatinine, Serum 1.200 MG/ 05/16/2023 Potassium 3.800 mm 05/13/2023 Magnesium 1.800 MG/ 04/06/2023 ALT (SGPT) 14.000 IU/ 04/06/2023   Labs 12/05/2017:  Total cholesterol 333, triglycerides 255, HDL 48, LDL 234. Non-HDL cholesterol 285.   Current Outpatient Medications:  .  acetaminophen (TYLENOL) 650 MG CR tablet, Take 1,300 mg by mouth every 8 (eight) hours as needed for pain., Disp: , Rfl:  .  aspirin EC 81 MG tablet, Take 81 mg by mouth daily., Disp: , Rfl:  .  carboxymethylcellulose (REFRESH PLUS) 0.5 % SOLN, Place 1 drop into both eyes 4 (four) times daily as needed (dry eyes)., Disp: , Rfl:  .  carvedilol (COREG) 3.125 MG tablet, Take 1 tablet (3.125 mg total) by mouth 2 (two) times daily with a meal., Disp: , Rfl:  .  cetirizine (ZYRTEC) 10 MG tablet, Take 10 mg by mouth daily as needed for allergies., Disp: , Rfl:  .  Cholecalciferol (VITAMIN D-3) 5000 units TABS, Take 5,000 tablets by mouth every evening. , Disp: , Rfl:  .  cyclobenzaprine (FLEXERIL) 10 MG tablet, Take 10 mg by mouth 3 (three) times daily as needed for muscle spasms., Disp: , Rfl:  .  dapagliflozin propanediol (FARXIGA) 5 MG TABS tablet, Take 10 mg by mouth daily., Disp: , Rfl:  .  ezetimibe (ZETIA) 10 MG tablet, Take 1 tablet (10 mg total) by mouth daily. take after dinner, Disp: 90 tablet, Rfl: 3 .  Ferrous Sulfate Dried 45 MG TBCR, Take 45 mg by mouth 2 (two) times daily., Disp: , Rfl:  .  fluticasone (FLONASE) 50 MCG/ACT nasal spray, Place 1 spray into both nostrils daily as needed for allergies or rhinitis., Disp: , Rfl:  .  isosorbide mononitrate (IMDUR) 30 MG 24 hr tablet, Take 1 tablet (30 mg total)  by mouth daily., Disp: 90 tablet, Rfl: 3 .  KERENDIA 10 MG TABS, Take 10 mg by mouth daily., Disp: , Rfl:  .  losartan (COZAAR) 100 MG tablet, Take 50 mg by mouth at bedtime., Disp: , Rfl:  .  nitroGLYCERIN (NITROSTAT) 0.4 MG SL tablet, Place 1 tablet (0.4 mg total) under the tongue every 5 (five) minutes as needed for chest pain., Disp:  90 tablet, Rfl: 3 .  omeprazole (PRILOSEC) 40 MG capsule, Take 40 mg by mouth daily., Disp: , Rfl:  .  rosuvastatin (CRESTOR) 10 MG tablet, Take 1 tablet (10 mg total) by mouth daily. (Patient taking differently: Take 10 mg by mouth at bedtime.), Disp: 90 tablet, Rfl: 1 .  Semaglutide, 1 MG/DOSE, (OZEMPIC, 1 MG/DOSE,) 2 MG/1.5ML SOPN, Inject into the skin., Disp: , Rfl:  .  senna (SENOKOT) 8.6 MG tablet, Take 2 tablets by mouth daily as needed for constipation., Disp: , Rfl:  .  tacrolimus (PROGRAF) 1 MG capsule, Take 1 mg by mouth 2 (two) times daily., Disp: , Rfl:  .  torsemide (DEMADEX) 20 MG tablet, Take 10 mg by mouth daily. May take an additional dose if needed for fluid, Disp: , Rfl:    No orders of the defined types were placed in this encounter.  There are no discontinued medications.     Radiology:   Coronary Calcium Score 02/11/2020: 1. Three-vessel coronary artery calcification. 2. Total Agatston Score: 423 3. MESA age and sex matched database percentile: 95 4. Small RIGHT lobe pulmonary nodule. No follow-up needed if patient is low-risk. Non-contrast chest CT can be considered in 12 months if patient is high-risk.  Cardiac Studies:   Echocardiogram 01/07/2021: Normal LV systolic function with visual EF 60-65%. Left ventricle cavity is normal in size. Mild left ventricular hypertrophy. Normal global wall motion. Normal diastolic filling pattern, normal LAP.  Aortic valve sclerosis without stenosis. Mild tricuspid regurgitation. No evidence of pulmonary hypertension. Compared to prior study dated 12/16/2017: no significant change.    Exercise Myoview stress test 01/12/2021: Exercise nuclear stress test was performed using Bruce protocol. Patient reached 5.8 METS, and 90% of age predicted maximum heart rate. Exercise capacity was low. Non-limiting chest pain reported. Heart rate and hemodynamic response were normal. Stress EKG revealed no ischemic changes. Normal myocardial perfusion. Stress LVEF 60%. Low risk study.  Left Heart Catheterization 05/11/22:  LV: Normal LV systolic function, EF 65 to 70%.  No significant mitral regurgitation.  Normal EDP.  No pressure gradient across the aortic valve. LM: Nonexistent.  Has separate ostia for LAD and CX. LAD: Large vessel, has mild 10 to 20% luminal irregularity in the proximal and mid segment.  No significant disease otherwise. CX: Large vessel, minimal disease. RCA: Dominant.  Proximal to mid segment has mild luminal irregularity of at most 20% stenosis.   Impression: Suspect she probably had coronary spasm at mildly diseased vessels.  Symptoms are very classic for angina pectoris.  Continue medical therapy.  EKG   EKG 06/23/2023: Normal sinus rhythm at rate of 85 bpm, leftward enlargement, leftward axis.  Incomplete right bundle branch block.  No evidence of ischemia.  Compared to 05/07/2022, no significant change.   Assessment     ICD-10-CM   1. Angina pectoris (HCC)  I20.9 EKG 12-Lead    2. Coronary artery disease involving native coronary artery of native heart with angina pectoris with documented spasm (HCC)  I25.111     3. Mixed hyperlipidemia  E78.2       Orders Placed This Encounter  Procedures  . EKG 12-Lead   No orders of the defined types were placed in this encounter.   Recommendations:   Mallory Lewis  is a 67 y.o.  with type II diabetes mellitus, GERD, biopsy-proven nephrotic syndrome (FSGS NOS), on tacrolimus therapy, severe mixed hyperlipidemia related to nephrotic syndrome, elevated LP(a), elevated coronary calcium score in the 95th  percentile, family  history of premature CAD in her brother at age 105Y, vasospastic angina presents here for follow-up.  1. Angina pectoris (HCC) Patient symptoms of angina has significantly improved with weight loss, diabetes control and also lipid control.  In view of soft blood pressure, I have advised her to discontinue isosorbide mononitrate.  She has lost about 40 pounds in weight since being on Ozempic.  If she is still continues to have soft blood pressure, she could certainly discontinue carvedilol also as she is only on 3.125 mg dose. - EKG 12-Lead  2. Coronary artery disease involving native coronary artery of native heart with angina pectoris with documented spasm (HCC) From coronary disease standpoint, she is on appropriate medical therapy.  She is feeling the best she has in quite a while.  3. Mixed hyperlipidemia Reviewed her lipids, excellent control of LDL and also triglycerides.  Now diabetes is also well-controlled.  Continue present medication management with continued primary prevention/secondary prevention for CAD, I will see her back on a as needed basis.  Normal physical exam and normal EKG.    Yates Decamp, MD, [facility] 06/23/2023, 11:30 AM Office: [phone]

## 2023-06-23 NOTE — Patient Instructions (Signed)
I am discontinuing isosorbide mononitrate as her chest pain symptoms have significantly improved with weight loss and diabetes control and cholesterol control.  You can continue to use sublingual nitroglycerin as needed.  Restart isosorbide mononitrate if chest pain recurs frequently.  If you continue to have low blood pressure, the next drug that he can stop his carvedilol.  Please contact me if you have any questions.
